# Patient Record
Sex: Female | Born: 1985 | Race: White | Hispanic: No | Marital: Married | Smoking: Never smoker
Health system: Southern US, Community
[De-identification: ages and names within clinical notes are randomized; demographics above are authoritative.]

## PROBLEM LIST (undated history)

## (undated) DIAGNOSIS — E785 Hyperlipidemia, unspecified: Secondary | ICD-10-CM

## (undated) DIAGNOSIS — G43909 Migraine, unspecified, not intractable, without status migrainosus: Secondary | ICD-10-CM

## (undated) DIAGNOSIS — K589 Irritable bowel syndrome without diarrhea: Secondary | ICD-10-CM

## (undated) DIAGNOSIS — K219 Gastro-esophageal reflux disease without esophagitis: Secondary | ICD-10-CM

## (undated) DIAGNOSIS — N809 Endometriosis, unspecified: Secondary | ICD-10-CM

## (undated) DIAGNOSIS — R7611 Nonspecific reaction to tuberculin skin test without active tuberculosis: Secondary | ICD-10-CM

## (undated) HISTORY — DX: Endometriosis, unspecified: N80.9

## (undated) HISTORY — DX: Hyperlipidemia, unspecified: E78.5

## (undated) HISTORY — DX: Irritable bowel syndrome, unspecified: K58.9

## (undated) HISTORY — DX: Gastro-esophageal reflux disease without esophagitis: K21.9

## (undated) HISTORY — DX: Nonspecific reaction to tuberculin skin test without active tuberculosis: R76.11

## (undated) HISTORY — PX: TONSILLECTOMY AND ADENOIDECTOMY: SUR1326

## (undated) HISTORY — PX: ENDOMETRIAL BIOPSY: SHX622

## (undated) HISTORY — DX: Migraine, unspecified, not intractable, without status migrainosus: G43.909

---
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
## 2005-07-23 ENCOUNTER — Other Ambulatory Visit: Admission: RE | Admit: 2005-07-23 | Discharge: 2005-07-23 | Payer: Self-pay | Admitting: Obstetrics and Gynecology

## 2005-08-13 ENCOUNTER — Encounter: Admission: RE | Admit: 2005-08-13 | Discharge: 2005-11-11 | Payer: Self-pay | Admitting: Obstetrics and Gynecology

## 2006-05-03 ENCOUNTER — Emergency Department (HOSPITAL_COMMUNITY): Admission: EM | Admit: 2006-05-03 | Discharge: 2006-05-03 | Payer: Self-pay | Admitting: Family Medicine

## 2006-08-03 ENCOUNTER — Emergency Department (HOSPITAL_COMMUNITY): Admission: EM | Admit: 2006-08-03 | Discharge: 2006-08-03 | Payer: Self-pay | Admitting: Family Medicine

## 2006-09-16 ENCOUNTER — Other Ambulatory Visit: Admission: RE | Admit: 2006-09-16 | Discharge: 2006-09-16 | Payer: Self-pay | Admitting: Obstetrics and Gynecology

## 2009-03-22 ENCOUNTER — Encounter: Admission: RE | Admit: 2009-03-22 | Discharge: 2009-03-22 | Payer: Self-pay | Admitting: Internal Medicine

## 2010-05-14 ENCOUNTER — Ambulatory Visit: Payer: Self-pay | Admitting: Family Medicine

## 2010-05-14 DIAGNOSIS — G43909 Migraine, unspecified, not intractable, without status migrainosus: Secondary | ICD-10-CM

## 2010-05-14 LAB — CONVERTED CEMR LAB: Blood Glucose, Fingerstick: 104

## 2010-12-11 NOTE — Assessment & Plan Note (Signed)
Summary: HEADACHES & NAUSEA/KH   Vital Signs:  Patient Profile:   25 Years Old Female CC:      HAs, nausea, eye pain X 2 weeks, rm 1 Height:     67 inches Weight:      252 pounds O2 Sat:      98 % O2 treatment:    Room Air Temp:     97.4 degrees F oral Pulse rate:   84 / minute Resp:     16 per minute BP sitting:   129 / 74  (right arm) Cuff size:   large  Pt. in pain?   yes    Location:   head    Intensity:   9    Type:       aching  Vitals Entered By: Lajean Saver RN (May 14, 2010 12:00 PM)              Is Patient Diabetic? No CBG Result 104      Updated Prior Medication List: LOESTRIN FE 1.5/30 1.5-30 MG-MCG TABS (NORETHIN ACE-ETH ESTRAD-FE) once daily GABAPENTIN 600 MG TABS (GABAPENTIN) once daily FLEXERIL 10 MG TABS (CYCLOBENZAPRINE HCL) prn  Current Allergies: No known allergies History of Present Illness Chief Complaint: HAs, nausea, eye pain X 2 weeks, rm 1 History of Present Illness:  Subjective:  Patient has a long history of migraine headaches for which she normally takes Neurontin,  and Flexeril as needed.  She is a Runner, broadcasting/film/video, and since being out of school for the past two weeks, her headaches have occurred daily.  She awakens with a headache, and goes to bed with the headaches, although they do not awaken her.  She occasionally has nausea.  She has increased her dose of Neurontin without improvement.  No neurologic symptoms.  No fevers, chills, and sweats.   She has a family history of diabetes, and notes that she has increased thirst but no urinary frequency.  REVIEW OF SYSTEMS Constitutional Symptoms      Denies fever, chills, night sweats, weight loss, weight gain, and fatigue.  Eyes       Complains of eye pain and glasses.      Denies change in vision, eye discharge, contact lenses, and eye surgery.      Comments: pressure Ear/Nose/Throat/Mouth       Denies hearing loss/aids, change in hearing, ear pain, ear discharge, dizziness, frequent runny  nose, frequent nose bleeds, sinus problems, sore throat, hoarseness, and tooth pain or bleeding.  Respiratory       Denies dry cough, productive cough, wheezing, shortness of breath, asthma, bronchitis, and emphysema/COPD.  Cardiovascular       Denies murmurs, chest pain, and tires easily with exhertion.    Gastrointestinal       Complains of nausea/vomiting.      Denies stomach pain, diarrhea, constipation, blood in bowel movements, and indigestion.      Comments: no vomiting Genitourniary       Denies painful urination, kidney stones, and loss of urinary control. Neurological       Complains of headaches.      Denies paralysis, seizures, and fainting/blackouts. Musculoskeletal       Denies muscle pain, joint pain, joint stiffness, decreased range of motion, redness, swelling, muscle weakness, and gout.  Skin       Denies bruising, unusual mles/lumps or sores, and hair/skin or nail changes.  Psych       Denies mood changes, temper/anger issues, anxiety/stress, speech problems, depression, and  sleep problems. Other Comments: Patient has had migrianes in the past, this one has now lasted 2 weeks. She recently moved to [place], has a new PCP she has seen once but they have not addressed her migraines yet.   Past History:  Past Medical History: Migrianes endometriosis  Past Surgical History: Tonsillectomy 1992 laproscopic for endometriosis 2005  Family History: Mother- Migrianes  Social History: Never Smoked Alcohol use-no Drug use-no Occupation: Runner, broadcasting/film/video Single Smoking Status:  never Drug Use:  no   Objective:  Appearance:  Patient is obese but otherwise appears healthy, stated age, and in no acute distress  Eyes:  Pupils are equal, round, and reactive to light and accomdation.  Extraocular movement is intact.  Conjunctivae are not inflamed.  Fundi normal Ears:  Canals normal.  Tympanic membranes normal.   Nose:  Normal septum.  Normal turbinates, mildly congested.    No sinus tenderness present.  Mouth:  No lesions Pharynx:  Normal  Neck:  Supple.  No adenopathy is present.  No thyromegaly is present  Lungs:  Clear to auscultation.  Breath sounds are equal.  Heart:  Regular rate and rhythm without murmurs, rubs, or gallops.  Abdomen:  Nontender without masses or hepatosplenomegaly.  Bowel sounds are present.  No CVA or flank tenderness.  Neurologic:  Cranial nerves 2 through 12 are normal.  Patellar and elbow reflexes are normal.  Cerebellar function is intact.  Gait and station are normal.  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  Max of 6/day  #15 (fifteen) x 0, 05/14/2010, Donna Christen MD  New Orders: New Patient Level III [99203] Capillary Blood Glucose/CBG [27253] Planning Comments:   Rx given for Fioricet and Promethazine for occasional use. Recommend that she follow-up with neurologist for further evaluation and workup.   The patient and/or caregiver has been counseled thoroughly with regard to medications prescribed including dosage, schedule, interactions, rationale for use, and possible side effects and they verbalize understanding.  Diagnoses and expected course of recovery discussed and will return if not improved as expected or if the condition worsens. Patient and/or caregiver verbalized understanding.  Prescriptions: PROMETHAZINE HCL 25 MG TABS (PROMETHAZINE HCL) 1 by mouth q4 to 6hr as needed nausea  #12 x 0   Entered and Authorized by:   Donna Christen MD   Signed by:   Donna Christen MD on 05/14/2010   Method used:   Print then Give to Patient   RxID:   [id number] FIORICET 50-325-40 MG TABS  [facility]) One or two by mouth q4 to 6hr as needed headache.  Max of 6/day  #15 (fifteen) x 0   Entered and Authorized by:   Donna Christen MD   Signed by:   Donna Christen MD on 05/14/2010   Method used:   Print then Give to Patient   RxID:   [id number]   Orders Added: 1)  New Patient Level III [06301] 2)  Capillary Blood Glucose/CBG [60109]

## 2011-02-11 ENCOUNTER — Other Ambulatory Visit: Payer: Self-pay | Admitting: Obstetrics and Gynecology

## 2011-03-29 NOTE — H&P (Signed)
Gloria Dyer, MANOCCHIO                      ACCOUNT NO.:  [id number]   MEDICAL RECORD NO.:  [id number]                   PATIENT TYPE:  OPS   LOCATION:                                       FACILITY:  [facility]   PHYSICIAN:  James A. Ashley Royalty, M.D.             DATE OF BIRTH:   DATE OF ADMISSION:  06/15/2004  DATE OF DISCHARGE:                                HISTORY & PHYSICAL   HISTORY OF PRESENT ILLNESS:  [age]-year-old nulligravida presented to  me, May 25, 2004, complaining of pelvic pain.  It is of some 18 months'  duration.  When it occurs, it seems to last most of the day.  It is quite  debilitating.  She also has significant dysmenorrhea.  She was first seen in  my office, January 2005, by Velora Mediate, NP.  A presumptive diagnosis of  endometriosis was rendered and the patient was treated with Seasonale.  She  seemed to do reasonably well, until approximately 2-1/2 months into the  treatment, when she began having breakthrough bleeding.  The patient  subsequently got an opinion from another OB/GYN physician in this community  and she was placed on monophasic oral contraceptives provided in monthly  increments.  Her symptoms persisted.  She stated that the particular  physician advocated Lupron and the patient and her mother were uncomfortable  with that without a laparoscopy; hence, she is for diagnostic/operative  laparoscopy.   MEDICATIONS:  Albuterol, Zomig.   PAST MEDICAL HISTORY:   MEDICAL:  1. Asthma.  2. Migraines.   SURGICAL:  Tonsillectomy.   ALLERGIES:  None.   ALLERGIES:  Noncontributory.   SOCIAL HISTORY:  The patient denies use of tobacco or significant alcohol.  She is a Holiday representative at [employer].  She states she has never  been sexually active.   PHYSICAL EXAMINATION:  GENERAL:  Well-developed, well-nourished, pleasant  white female in no acute distress, afebrile.  VITAL SIGNS:  Stable.  SKIN:  Skin warm and dry without lesions.  LYMPH:  There is no supraclavicular, cervical or inguinal adenopathy.  HEENT:  Normocephalic.  NECK:  Neck supple without thyromegaly.  CHEST:  Lungs are clear.  CARDIAC:  Regular rate and rhythm.  BREASTS:  Breast exam is deferred as it was current, January 2005.  ABDOMEN:  Abdomen is soft and nontender without masses or organomegaly.  Bowel sounds are active.  MUSCULOSKELETAL:  Examination reveals full range of motion without edema,  cyanosis or CVA tenderness.  PELVIC:  Examination (May 25, 2004):  External genitalia within normal  limits.  Urethral meatus was normal.  Vagina and cervix were without gross  lesions.  Bimanual examination reveals the uterus to be approximately 8.0 x  4.4 cm and no adnexal masses are palpable.   IMPRESSION:  1. Pelvic pain -- etiology uncertain.  Differential include endometriosis,     primary gastrointestinal, adhesions, adnexal pathology, etc.  2. Asthma.  3. Migraine  headaches.   PLAN:  Diagnostic/operative laparoscopy.  Risks, benefits, complications and  alternatives were fully discussed with the patient.  Possibility of  unilateral salpingo-oophorectomy discussed and accepted.  Possibility of  exploratory laparotomy discussed and accepted.  Questions invited and  answered.                                               James A. Ashley Royalty, M.D.    JAM/MEDQ  D:  06/15/2004  T:  06/15/2004  Job:  [id number]

## 2011-03-29 NOTE — Op Note (Signed)
NAME:  Gloria Dyer, Gloria Dyer                        ACCOUNT NO.:  [id number]   MEDICAL RECORD NO.:  [id number]                   PATIENT TYPE:  AMB   LOCATION:  [facility]                                  FACILITY:  [facility]   PHYSICIAN:  James A. Ashley Royalty, M.D.             DATE OF BIRTH:  1986-04-13   DATE OF PROCEDURE:  06/15/2004  DATE OF DISCHARGE:  06/15/2004                                 OPERATIVE REPORT   PREOPERATIVE DIAGNOSIS:  Pelvic pain, rule out endometriosis.   POSTOPERATIVE DIAGNOSIS:  Pelvic pain, rule out endometriosis.  Pathology  pending.   PROCEDURES:  1. Diagnostic/operative laparoscopy.  2. Biopsies of left ovary and peritoneum (two).   SURGEON:  Rudy Jew. Ashley Royalty, M.D.   ANESTHESIA:  General.   ESTIMATED BLOOD LOSS:  Less than 25 mL.   COMPLICATIONS:  None.   PACKS AND DRAINS:  None.   PROCEDURE:  The patient was taken to the operating room and placed in the  dorsal supine position.  After general anesthesia was administered, she was  placed in the lithotomy position and prepped and draped in the usual manner  for abdominal and vaginal surgery.  A posterior weighted retractor was  placed per vagina.  The anterior lip of the cervix was grasped with a single-  tooth tenaculum.  The Jarcho uterine manipulator was placed per cervix and  held in place with a tenaculum.  The bladder was drained with a red rubber  catheter.   A 1.2 cm infraumbilical incision was then made in the longitudinal plane.  A  size 10/11 disposable laparoscopic trocar was placed into the abdominal  cavity.  Location was verified by placement of the laparoscope.  There was  no evidence of any trauma.  Pneumoperitoneum was created with CO2 and  maintained throughout the procedure.  Again careful assessment of the field  revealed no evidence of trauma.  The pelvis was then surveyed.  The uterus  appeared normal size, shape, and contour.  At this point two 5 mm disposable  suprapubic trocars were  placed in the left and right lower quadrants,  respectively.  Transillumination and direct visualization techniques were  employed.  The pelvis was further inspected.  The uterus appeared to be  approximately 4 x 4 x 4 cm in terms of corporal dimensions.  I could  appreciate no obvious evidence of endometriosis on the uterus.  The  fallopian tubes were normal size, shape, and contour bilaterally with  luxuriant fimbriae.  The right ovary appeared to be normal size, shape, and  contour without evidence of any cysts or endometriosis.  The left ovary  appeared to be normal size, shape, and contour.  There was a pigmented  lesion on the ovary on the pole closest to the fimbriated end of the  fallopian tube.  There also appeared to be perhaps a cystic follicle on the  ovary.  The anterior cul-de-sac was clean.  The posterior cul-de-sac  contained two lesions.  In the left uterosacral area there was a pigmented  implant.  In addition, on the right aspect of the cul-de-sac there was also  a separate pigmented implant.  Using the laparoscopic biopsy forceps,  biopsies were obtained from all three aforementioned areas.  Hemostasis was  easily obtained with the bipolar cautery.  Careful attention was paid to  avoid the ureter or other vital structures.  All specimens were submitted to  pathology for histologic studies.  Copious irrigation was accomplished with  the Nezhat suction irrigator.  The remainder of the peritoneal surfaces were  noted to be smooth and glistening.  Appropriate photos were obtained before  and after biopsies.  The appendix appeared to be normal.   At this point the patient was felt to have benefitted maximally from the  surgical procedure.  The abdominal instruments were removed and  pneumoperitoneum evacuated.  Fascial defects were closed with 0 Vicryl in an  interrupted fashion.  The skin was closed with 3-0 Monocryl in a  subcuticular fashion.  The vaginal instruments were  removed.  Two small  sulcal lacerations were easily closed with 3-0 chromic catgut.  Approximately 10 mL of 0.5% Marcaine with 1:200,000 epinephrine were  instilled into the incisions to aid in postoperative analgesia.   At this point the entire field was noted to be hemostatic and the procedure  terminated.  The patient was then taken to the recovery room in excellent  condition.                                               James A. Ashley Royalty, M.D.    JAM/MEDQ  D:  06/15/2004  T:  06/17/2004  Job:  [id number]

## 2014-05-05 ENCOUNTER — Other Ambulatory Visit: Payer: Self-pay | Admitting: Family Medicine

## 2014-05-05 DIAGNOSIS — R197 Diarrhea, unspecified: Secondary | ICD-10-CM

## 2014-05-06 ENCOUNTER — Ambulatory Visit
Admission: RE | Admit: 2014-05-06 | Discharge: 2014-05-06 | Disposition: A | Discharge status: Home / Self Care | Payer: BC Managed Care – PPO | Source: Ambulatory Visit | Attending: Family Medicine | Admitting: Family Medicine

## 2014-05-06 DIAGNOSIS — R197 Diarrhea, unspecified: Secondary | ICD-10-CM

## 2014-05-06 MED ORDER — IOHEXOL 300 MG/ML  SOLN
125.0000 mL | Freq: Once | INTRAMUSCULAR | Status: AC | PRN
  Administered 2014-05-06: 125 mL via INTRAVENOUS

## 2014-10-14 LAB — HM PAP SMEAR: HM Pap smear: NORMAL

## 2016-11-01 ENCOUNTER — Ambulatory Visit (INDEPENDENT_AMBULATORY_CARE_PROVIDER_SITE_OTHER): Payer: BC Managed Care – PPO | Admitting: Primary Care

## 2016-11-01 ENCOUNTER — Encounter: Payer: Self-pay | Admitting: Primary Care

## 2016-11-01 ENCOUNTER — Encounter (INDEPENDENT_AMBULATORY_CARE_PROVIDER_SITE_OTHER): Payer: Self-pay

## 2016-11-01 ENCOUNTER — Other Ambulatory Visit: Payer: Self-pay | Admitting: Primary Care

## 2016-11-01 VITALS — BP 108/70 | HR 87 | Temp 97.8°F | Ht 67.5 in | Wt 273.8 lb

## 2016-11-01 DIAGNOSIS — E785 Hyperlipidemia, unspecified: Secondary | ICD-10-CM

## 2016-11-01 DIAGNOSIS — G43901 Migraine, unspecified, not intractable, with status migrainosus: Secondary | ICD-10-CM | POA: Diagnosis not present

## 2016-11-01 DIAGNOSIS — K219 Gastro-esophageal reflux disease without esophagitis: Secondary | ICD-10-CM

## 2016-11-01 DIAGNOSIS — G43701 Chronic migraine without aura, not intractable, with status migrainosus: Secondary | ICD-10-CM

## 2016-11-01 DIAGNOSIS — J329 Chronic sinusitis, unspecified: Secondary | ICD-10-CM | POA: Diagnosis not present

## 2016-11-01 LAB — LIPID PANEL
Cholesterol: 137 mg/dL (ref 0–200)
HDL: 37.6 mg/dL — ABNORMAL LOW (ref >39.00)
LDL Cholesterol: 86 mg/dL (ref 0–99)
NONHDL: 99.77
Total CHOL/HDL Ratio: 4
Triglycerides: 67 mg/dL (ref 0.0–149.0)
VLDL: 13.4 mg/dL (ref 0.0–40.0)

## 2016-11-01 LAB — COMPREHENSIVE METABOLIC PANEL
ALBUMIN: 3.6 g/dL (ref 3.5–5.2)
ALK PHOS: 76 U/L (ref 39–117)
ALT: 15 U/L (ref 0–35)
AST: 18 U/L (ref 0–37)
BILIRUBIN TOTAL: 0.6 mg/dL (ref 0.2–1.2)
BUN: 8 mg/dL (ref 6–23)
CO2: 26 mEq/L (ref 19–32)
CREATININE: 0.61 mg/dL (ref 0.40–1.20)
Calcium: 8.6 mg/dL (ref 8.4–10.5)
Chloride: 107 mEq/L (ref 96–112)
GFR: 122.2 mL/min (ref >60.00)
Glucose, Bld: 95 mg/dL (ref 70–99)
Potassium: 4.1 mEq/L (ref 3.5–5.1)
SODIUM: 139 meq/L (ref 135–145)
TOTAL PROTEIN: 6.9 g/dL (ref 6.0–8.3)

## 2016-11-01 LAB — HEMOGLOBIN A1C: HEMOGLOBIN A1C: 5.4 % (ref 4.6–6.5)

## 2016-11-01 MED ORDER — MONTELUKAST SODIUM 10 MG PO TABS: 10.0000 mg | ORAL_TABLET | Freq: Every day | ORAL | 3 refills | Status: DC

## 2016-11-01 MED ORDER — ROSUVASTATIN CALCIUM 5 MG PO TABS: 5.0000 mg | ORAL_TABLET | Freq: Every day | ORAL | 3 refills | Status: DC

## 2016-11-01 MED ORDER — RANITIDINE HCL 150 MG PO TABS: 150.0000 mg | ORAL_TABLET | Freq: Two times a day (BID) | ORAL | 1 refills | Status: DC

## 2016-11-01 MED ORDER — GABAPENTIN 600 MG PO TABS: 600.0000 mg | ORAL_TABLET | Freq: Every day | ORAL | 3 refills | Status: DC

## 2016-11-01 NOTE — Progress Notes (Signed)
Pre visit review using our clinic review tool, if applicable. No additional management support is needed unless otherwise documented below in the visit note. 

## 2016-11-01 NOTE — Progress Notes (Signed)
   Subjective:    Patient ID: Gloria Dyer, female    DOB: 19-Jun-1986, 30 y.o.   MRN: [id number]  HPI  Ms. Gloria Dyer is a 30 year old female who presents today to establish care and discuss the problems mentioned below. Will obtain old records.  1) Hyperlipidemia: Diagnosed two years ago. Currently managed on atorvastatin 10 mg. Her last lipid panel was in June 2017. She does experience cramping to her legs and feet that has been ongoing for the past 1 year.  2) GERD/IBS: Currently managed on pantoprazole 20 mg daily which she's taken for several years. No recent flare in 1 year. Previously followed by GI who underwent extensive testing, but she'd like to transfer care of GERD to our clinic.  3) Asthma/Recurrent Sinusitis: Currently managed on Singulair 10 mg, mostly for recurrent sinusitis. No asthma symptoms in years. She denies wheezing and shortness of breath. She does not have an inhaler.  4) Migraines: Chronic since high school. Migraines occur once monthly on average. She's currently managed on Gabapentin 600 mg once daily. Rarely experiences headaches. She feels well managed.  Review of Systems  Eyes: Negative for photophobia.  Respiratory: Negative for cough, shortness of breath and wheezing.   Gastrointestinal:       Gloria Dyer, no recent symptoms  Musculoskeletal: Positive for myalgias.  Neurological: Negative for headaches.       Past Medical History:  Diagnosis Date  . Endometriosis   . GERD (gastroesophageal reflux disease)   . Hyperlipidemia   . IBS (irritable bowel syndrome)   . Migraines   . 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 Marital status: Married    Spouse name: N/A  . Number of children: N/A  . Years of education: N/A   Occupational History  . Not on file.   Social History Main Topics  . Smoking status: Never Smoker  . Smokeless tobacco: Not on file  . Alcohol use No  . Drug use: Unknown  . Sexual activity: Not  on file   Other Topics Concern  . Not on file   Social History Narrative   Single.   No children.   Works as a Runner, broadcasting/film/videoteacher.   Currently in graduate school.    Past Surgical History:  Procedure Laterality Date  . ENDOMETRIAL BIOPSY      Family History  Problem Relation Age of Onset  . Breast cancer Mother   . Asthma Mother   . Hyperlipidemia Father   . Diabetes Paternal Grandmother   . Diabetes Paternal Grandfather     No Known Allergies  No current outpatient prescriptions on file prior to visit.   No current facility-administered medications on file prior to visit.     BP 108/70   Pulse 87   Temp 97.8 F (36.6 C) (Oral)   Ht 5' 7.5" (1.715 m)   Wt 273 lb 12.8 oz (124.2 kg)   SpO2 98%   BMI 42.25 kg/m    Objective:   Physical Exam  Constitutional: She appears well-nourished.  Neck: Neck supple.  Cardiovascular: Normal rate and regular rhythm.   Pulmonary/Chest: Effort normal and breath sounds normal.  Skin: Skin is warm and dry.  Psychiatric: She has a normal mood and affect.          Assessment & Plan:

## 2016-11-01 NOTE — Assessment & Plan Note (Signed)
Managed on atorvastatin 10 mg. Strong FH of heart disease and diabetes. Check lipids today, if stable, then switch to low dose Crestor given myalgias. Check LFT's and A1C today.

## 2016-11-01 NOTE — Assessment & Plan Note (Signed)
Managed on Singulair daily for prevention. No sinus infection in 1 year. Continue same, refill provided.

## 2016-11-01 NOTE — Assessment & Plan Note (Signed)
Chronic since high school. Managed on gabapentin for prevention, doing well. Continue current regimen, refill provided.

## 2016-11-01 NOTE — Assessment & Plan Note (Signed)
Managed on PPI x 2 years, would like to see her try to wean down. Rx for Zantac sent to pharmacy. Will have her try once daily, if no improvement then twice daily. Will check on her in 1 month.

## 2016-11-01 NOTE — Patient Instructions (Signed)
Start Zantac 150 mg tablets for acid reflux. Start by taking 1 tablet by mouth daily. May increase to 1 tablet twice daily. I will call you in a few weeks for an update.  Complete lab work prior to leaving today.   I sent refills for gabapentin and Singulair to your pharmacy. I will refill the cholesterol medication once I receive your cholesterol results.  We will be in touch later today or next week!  It was a pleasure to meet you today! Please don't hesitate to call me with any questions. Welcome to [facility]!  Food Choices for Gastroesophageal Reflux Disease, Adult When you have gastroesophageal reflux disease (GERD), the foods you eat and your eating habits are very important. Choosing the right foods can help ease the discomfort of GERD. What general guidelines do I need to follow?  Choose fruits, vegetables, whole grains, low-fat dairy products, and low-fat meat, fish, and poultry.  Limit fats such as oils, salad dressings, butter, nuts, and avocado.  Keep a food diary to identify foods that cause symptoms.  Avoid foods that cause reflux. These may be different for different people.  Eat frequent small meals instead of three large meals each day.  Eat your meals slowly, in a relaxed setting.  Limit fried foods.  Cook foods using methods other than frying.  Avoid drinking alcohol.  Avoid drinking large amounts of liquids with your meals.  Avoid bending over or lying down until 2-3 hours after eating. What foods are not recommended? The following are some foods and drinks that may worsen your symptoms: Vegetables  Tomatoes. Tomato juice. Tomato and spaghetti sauce. Chili peppers. Onion and garlic. Horseradish. Fruits  Oranges, grapefruit, and lemon (fruit and juice). Meats  High-fat meats, fish, and poultry. This includes hot dogs, ribs, ham, sausage, salami, and bacon. Dairy  Whole milk and chocolate milk. Sour cream. Cream. Butter. Ice cream. Cream  cheese. Beverages  Coffee and tea, with or without caffeine. Carbonated beverages or energy drinks. Condiments  Hot sauce. Barbecue sauce. Sweets/Desserts  Chocolate and cocoa. Donuts. Peppermint and spearmint. Fats and Oils  High-fat foods, including [place] fries and potato chips. Other  Vinegar. Strong spices, such as black pepper, white pepper, red pepper, cayenne, curry powder, cloves, ginger, and chili powder. The items listed above may not be a complete list of foods and beverages to avoid. Contact your dietitian for more information.  This information is not intended to replace advice given to you by your health care provider. Make sure you discuss any questions you have with your health care provider. Document Released: 10/28/2005 Document Revised: 04/04/2016 Document Reviewed: 09/01/2013 Elsevier Interactive Patient Education  2017 [employer]

## 2016-11-03 ENCOUNTER — Encounter: Payer: Self-pay | Admitting: Primary Care

## 2016-11-11 ENCOUNTER — Encounter: Payer: Self-pay | Admitting: Primary Care

## 2016-11-12 ENCOUNTER — Other Ambulatory Visit: Payer: Self-pay | Admitting: Primary Care

## 2016-11-12 DIAGNOSIS — K219 Gastro-esophageal reflux disease without esophagitis: Secondary | ICD-10-CM

## 2016-11-12 MED ORDER — PANTOPRAZOLE SODIUM 20 MG PO TBEC: 20.0000 mg | DELAYED_RELEASE_TABLET | Freq: Every day | ORAL | 1 refills | Status: DC

## 2016-11-14 ENCOUNTER — Encounter: Payer: Self-pay | Admitting: Primary Care

## 2016-12-20 ENCOUNTER — Encounter: Payer: Self-pay | Admitting: Primary Care

## 2016-12-26 LAB — HM PAP SMEAR: HM PAP: NORMAL

## 2016-12-30 ENCOUNTER — Encounter: Payer: Self-pay | Admitting: Primary Care

## 2016-12-30 ENCOUNTER — Other Ambulatory Visit: Payer: Self-pay | Admitting: Primary Care

## 2016-12-30 DIAGNOSIS — IMO0001 Reserved for inherently not codable concepts without codable children: Secondary | ICD-10-CM

## 2017-01-14 ENCOUNTER — Ambulatory Visit: Payer: BC Managed Care – PPO | Admitting: Dietician

## 2017-01-23 ENCOUNTER — Encounter: Payer: Self-pay | Admitting: Primary Care

## 2017-01-29 ENCOUNTER — Encounter: Payer: BC Managed Care – PPO | Attending: Primary Care | Admitting: Dietician

## 2017-01-29 ENCOUNTER — Encounter: Payer: Self-pay | Admitting: Dietician

## 2017-01-29 VITALS — Ht 67.0 in | Wt 277.1 lb

## 2017-01-29 DIAGNOSIS — K589 Irritable bowel syndrome without diarrhea: Secondary | ICD-10-CM

## 2017-01-29 DIAGNOSIS — E6609 Other obesity due to excess calories: Secondary | ICD-10-CM

## 2017-01-29 DIAGNOSIS — Z6841 Body Mass Index (BMI) 40.0 and over, adult: Secondary | ICD-10-CM | POA: Diagnosis not present

## 2017-01-29 DIAGNOSIS — E785 Hyperlipidemia, unspecified: Secondary | ICD-10-CM

## 2017-01-29 DIAGNOSIS — IMO0001 Reserved for inherently not codable concepts without codable children: Secondary | ICD-10-CM

## 2017-01-29 NOTE — Progress Notes (Signed)
Medical Nutrition Therapy: Visit start time: 1630  end time: 1730  Assessment:  Diagnosis: obesity Past medical history: IBS, hyperlipidemia Psychosocial issues/ stress concerns: patient states she is not dealing well with stress, some family stress and busy schedule.  Preferred learning method:  . Auditory . Visual . Hands-on  Current weight: 277.1lbs with shoes and jacket Height: 5'7" Medications, supplements: reconciled list in medical record.  Progress and evaluation: Patient has worked on weight loss in the past; did weight watchers in high school, lost from about 240lbs to 185lbs. Regained weight in college. 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 Also mentions her sister has an eating disorder, and will finish with second treatment program in May. Dad had stroke 12/25/16, which triggered patient's concern for her own health.  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   Physical activity: walking, not on a regular basis due to work schedule during school year. Has treadmill at home, uses when on break.    Dietary Intake:  Usual eating pattern includes 3 meals and 1 snacks per day. Dining out frequency: 6 meals per week.  Breakfast: on way to work 6:45am pop tart. Water for drink. Hot apple cider for past week, not typical Snack: usually none, forgets to pack Lunch: 11:45am -- peanut butter jelly sandwich, cup of applesauce, sometimes string cheese, water Snack: pretzels 3:30-4pm Supper: takeout on the way home-- chick fila, personal pizza Snack: none Beverages: water, stopped caffeine drinks when diagnosed with IBS.  She reports dairy, fiber, large amounts of whole grain wheat will trigger IBS symptoms. She does not eat vegetables other than occasional salad.   Nutrition Care Education: Topics covered: weight  management with moderate fiber diet for IBS Basic nutrition: basic food groups, appropriate nutrient balance, appropriate meal and snack schedule, general nutrition guidelines    Weight control: Estimated energy needs at 1500kcal daily for weight loss. Provided guidance for balanced meals containing about 45%CHO, 25% protein, and 30% fat. Discussed simple meal options and snack options. Discussed role of physical activity in weight control, effects of dieting history on metabolism. Discussed benefits of tracking food intake.   Nutritional Diagnosis:  Laura-3.3 Overweight/obesity As related to excess calories, inactivity.  As evidenced by BMI 43, patient report.  Intervention: Instruction as noted above.   Set goals with direction from patient.    Encouraged her to seek ways to add movement into daily routine.   Education Materials given:  . Food lists/ Planning A Balanced Meal . Sample meal pattern/ menus . Snacking handout . Goals/ instructions   Learner/ who was taught:  . Patient   Level of understanding: Marland Kitchen. Verbalizes/ demonstrates competency  Demonstrated degree of understanding via:   Teach back Learning barriers: . None  Willingness to learn/ readiness for change: . Acceptance, ready for change  Monitoring and Evaluation:  Dietary intake, exercise, and body weight      follow up: 03/12/17

## 2017-01-29 NOTE — Patient Instructions (Signed)
   Add a healthy snack mid-morning, ideally with some healthy carbohydrate such as graham crackers, fruit, yogurt, or dry cereal like cheerios and combine with protein such as peanut butter, lowfat cheese, or nuts (if tolerated).   Eat a balanced meal for supper with lean protein, controlled portions of starches, and include a fruit if not a salad.   Track food intake using Fitbit app. If it doesn't work, try MyFitnessPal, or Lose It. Both are free apps for tracking intake.

## 2017-02-25 ENCOUNTER — Ambulatory Visit (INDEPENDENT_AMBULATORY_CARE_PROVIDER_SITE_OTHER): Payer: BC Managed Care – PPO | Admitting: Primary Care

## 2017-02-25 ENCOUNTER — Encounter: Payer: Self-pay | Admitting: Primary Care

## 2017-02-25 VITALS — BP 116/78 | HR 109 | Temp 97.8°F | Ht 67.5 in | Wt 276.8 lb

## 2017-02-25 DIAGNOSIS — J069 Acute upper respiratory infection, unspecified: Secondary | ICD-10-CM

## 2017-02-25 MED ORDER — BENZONATATE 200 MG PO CAPS: 200.0000 mg | ORAL_CAPSULE | Freq: Three times a day (TID) | ORAL | 0 refills | Status: DC | PRN

## 2017-02-25 NOTE — Progress Notes (Signed)
Pre visit review using our clinic review tool, if applicable. No additional management support is needed unless otherwise documented below in the visit note. 

## 2017-02-25 NOTE — Patient Instructions (Signed)
Your symptoms are representative of a viral illness which will resolve on its own over time. Our goal is to treat your symptoms in order to aid your body in the healing process and to make you more comfortable.   Nasal Congestion/Ear Pressure: Try using Flonase (fluticasone) nasal spray. Instill 1 spray in each nostril twice daily.   You may take Benzonatate capsules for cough. Take 1 capsule by mouth three times daily as needed for cough.  Please notify me if you start feeling worse Friday this week.  It was a pleasure to see you today!   Upper Respiratory Infection, Adult Most upper respiratory infections (URIs) are a viral infection of the air passages leading to the lungs. A URI affects the nose, throat, and upper air passages. The most common type of URI is nasopharyngitis and is typically referred to as "the common cold." URIs run their course and usually go away on their own. Most of the time, a URI does not require medical attention, but sometimes a bacterial infection in the upper airways can follow a viral infection. This is called a secondary infection. Sinus and middle ear infections are common types of secondary upper respiratory infections. Bacterial pneumonia can also complicate a URI. A URI can worsen asthma and chronic obstructive pulmonary disease (COPD). Sometimes, these complications can require emergency medical care and may be life threatening. What are the causes? Almost all URIs are caused by viruses. A virus is a type of germ and can spread from one person to another. What increases the risk? You may be at risk for a URI if:  You smoke.  You have chronic heart or lung disease.  You have a weakened defense (immune) system.  You are very young or very old.  You have nasal allergies or asthma.  You work in crowded or poorly ventilated areas.  You work in health care facilities or schools. What are the signs or symptoms? Symptoms typically develop 2-3 days after  you come in contact with a cold virus. Most viral URIs last 7-10 days. However, viral URIs from the influenza virus (flu virus) can last 14-18 days and are typically more severe. Symptoms may include:  Runny or stuffy (congested) nose.  Sneezing.  Cough.  Sore throat.  Headache.  Fatigue.  Fever.  Loss of appetite.  Pain in your forehead, behind your eyes, and over your cheekbones (sinus pain).  Muscle aches. How is this diagnosed? Your health care provider may diagnose a URI by:  Physical exam.  Tests to check that your symptoms are not due to another condition such as:  Strep throat.  Sinusitis.  Pneumonia.  Asthma. How is this treated? A URI goes away on its own with time. It cannot be cured with medicines, but medicines may be prescribed or recommended to relieve symptoms. Medicines may help:  Reduce your fever.  Reduce your cough.  Relieve nasal congestion. Follow these instructions at home:  Take medicines only as directed by your health care provider.  Gargle warm saltwater or take cough drops to comfort your throat as directed by your health care provider.  Use a warm mist humidifier or inhale steam from a shower to increase air moisture. This may make it easier to breathe.  Drink enough fluid to keep your urine clear or pale yellow.  Eat soups and other clear broths and maintain good nutrition.  Rest as needed.  Return to work when your temperature has returned to normal or as your health care  provider advises. You may need to stay home longer to avoid infecting others. You can also use a face mask and careful hand washing to prevent spread of the virus.  Increase the usage of your inhaler if you have asthma.  Do not use any tobacco products, including cigarettes, chewing tobacco, or electronic cigarettes. If you need help quitting, ask your health care provider. How is this prevented? The best way to protect yourself from getting a cold is to  practice good hygiene.  Avoid oral or hand contact with people with cold symptoms.  Wash your hands often if contact occurs. There is no clear evidence that vitamin C, vitamin E, echinacea, or exercise reduces the chance of developing a cold. However, it is always recommended to get plenty of rest, exercise, and practice good nutrition. Contact a health care provider if:  You are getting worse rather than better.  Your symptoms are not controlled by medicine.  You have chills.  You have worsening shortness of breath.  You have brown or red mucus.  You have yellow or brown nasal discharge.  You have pain in your face, especially when you bend forward.  You have a fever.  You have swollen neck glands.  You have pain while swallowing.  You have white areas in the back of your throat. Get help right away if:  You have severe or persistent:  Headache.  Ear pain.  Sinus pain.  Chest pain.  You have chronic lung disease and any of the following:  Wheezing.  Prolonged cough.  Coughing up blood.  A change in your usual mucus.  You have a stiff neck.  You have changes in your:  Vision.  Hearing.  Thinking.  Mood. This information is not intended to replace advice given to you by your health care provider. Make sure you discuss any questions you have with your health care provider. Document Released: 04/23/2001 Document Revised: 06/30/2016 Document Reviewed: 02/02/2014 Elsevier Interactive Patient Education  2017 [employer]

## 2017-02-25 NOTE — Progress Notes (Signed)
Subjective:    Patient ID: Gloria Dyer, female    DOB: May 14, 1986, 31 y.o.   MRN: [id number]  HPI  Gloria Dyer is a 31 year old female with a history for allergic rhinitis and recurrent sinusitis who presents today with a chief complaint of cough. She also reports nasal congestion, headache, ear fullness post nasal drip. Her cough is worse at night and is productive in the morning. She denies fevers, body aches, fatigue, sneezing, facial pressure. She's taken Ibuprofen and cough drops with some improvement. Her symptoms began 7 days ago.  Review of Systems  Constitutional: Negative for chills, fatigue and fever.  HENT: Positive for congestion and rhinorrhea. Negative for sinus pressure and sore throat.        Ear fullness  Respiratory: Positive for cough. Negative for shortness of breath.   Cardiovascular: Negative for chest pain.  Allergic/Immunologic: Positive for environmental allergies.  Neurological: Positive for headaches.       Past Medical History:  Diagnosis Date  . Endometriosis   . GERD (gastroesophageal reflux disease)   . Hyperlipidemia   . IBS (irritable bowel syndrome)   . Migraines   . 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 Marital status: Married    Spouse name: N/A  . Number of children: N/A  . Years of education: N/A   Occupational History  . Not on file.   Social History Main Topics  . Smoking status: Never Smoker  . Smokeless tobacco: Former Neurosurgeon  . Alcohol use No  . Drug use: Unknown  . Sexual activity: Not on file   Other Topics Concern  . Not on file   Social History Narrative   Single.   No children.   Works as a Runner, broadcasting/film/video.   Currently in graduate school.    Past Surgical History:  Procedure Laterality Date  . ENDOMETRIAL BIOPSY      Family History  Problem Relation Age of Onset  . Breast cancer Mother   . Asthma Mother   . Hyperlipidemia Father   . Diabetes Paternal Grandmother   .  Diabetes Paternal Grandfather     No Known Allergies  Current Outpatient Prescriptions on File Prior to Visit  Medication Sig Dispense Refill  . gabapentin (NEURONTIN) 600 MG tablet Take 1 tablet (600 mg total) by mouth at bedtime. 90 tablet 3  . Melatonin 3 MG TABS Take 3 mg by mouth.    . montelukast (SINGULAIR) 10 MG tablet Take 1 tablet (10 mg total) by mouth at bedtime. 90 tablet 3  . Norethindrone Acetate-Ethinyl Estradiol (JUNEL 1.5/30) 1.5-30 MG-MCG tablet Take by mouth.    . pantoprazole (PROTONIX) 20 MG tablet Take 1 tablet (20 mg total) by mouth daily. 90 tablet 1  . Probiotic Product (ALIGN PO) Take 1 tablet by mouth.    . rosuvastatin (CRESTOR) 5 MG tablet Take 1 tablet (5 mg total) by mouth daily. 90 tablet 3   No current facility-administered medications on file prior to visit.     BP 116/78   Pulse (!) 109   Temp 97.8 F (36.6 C) (Oral)   Ht 5' 7.5" (1.715 m)   Wt 276 lb 12.8 oz (125.6 kg)   SpO2 98%   BMI 42.71 kg/m    Objective:   Physical Exam  Constitutional: She appears well-nourished.  HENT:  Right Ear: Tympanic membrane and ear canal normal.  Left Ear: Tympanic membrane and  ear canal normal.  Nose: Right sinus exhibits no maxillary sinus tenderness and no frontal sinus tenderness. Left sinus exhibits no maxillary sinus tenderness and no frontal sinus tenderness.  Mouth/Throat: Oropharynx is clear and moist.  Eyes: Conjunctivae are normal.  Neck: Neck supple.  Cardiovascular: Normal rate and regular rhythm.   Pulmonary/Chest: Effort normal and breath sounds normal. She has no wheezes. She has no rales.  Dry cough during exam  Lymphadenopathy:    She has no cervical adenopathy.  Skin: Skin is warm and dry.          Assessment & Plan:  URI:  Cough, nasal congestion, rhinorrhea x 7 days. Some improvement with OTC treatment. Exam today not suggestive of bacterial involvement.  Suspect viral URI that should improve in 3 days. Rx for Tessalon  Pearls PRN. Discussed use of Flonase, Ibuprofen. Fluids, rest, follow up PRN.  Morrie Sheldon, NP

## 2017-03-05 ENCOUNTER — Encounter: Payer: Self-pay | Admitting: Primary Care

## 2017-03-08 ENCOUNTER — Encounter: Payer: Self-pay | Admitting: Dietician

## 2017-03-12 ENCOUNTER — Ambulatory Visit: Payer: BC Managed Care – PPO | Admitting: Dietician

## 2017-03-31 ENCOUNTER — Encounter: Payer: Self-pay | Admitting: Dietician

## 2017-04-03 ENCOUNTER — Ambulatory Visit: Payer: BC Managed Care – PPO | Admitting: Dietician

## 2017-04-25 ENCOUNTER — Encounter: Payer: Self-pay | Admitting: Dietician

## 2017-04-25 NOTE — Progress Notes (Signed)
Ms. Jannifer RodneyHartgrove has not yet responded to electronic message to reschedule her cancelled appointment. Sent letter to MD office.

## 2017-04-28 ENCOUNTER — Other Ambulatory Visit: Payer: Self-pay | Admitting: Primary Care

## 2017-04-28 DIAGNOSIS — K219 Gastro-esophageal reflux disease without esophagitis: Secondary | ICD-10-CM

## 2017-07-05 ENCOUNTER — Encounter: Payer: Self-pay | Admitting: Primary Care

## 2017-09-24 ENCOUNTER — Encounter: Payer: Self-pay | Admitting: Primary Care

## 2017-10-18 ENCOUNTER — Other Ambulatory Visit: Payer: Self-pay | Admitting: Primary Care

## 2017-10-18 DIAGNOSIS — K219 Gastro-esophageal reflux disease without esophagitis: Secondary | ICD-10-CM

## 2017-10-28 ENCOUNTER — Other Ambulatory Visit: Payer: Self-pay | Admitting: Primary Care

## 2017-10-28 DIAGNOSIS — G43901 Migraine, unspecified, not intractable, with status migrainosus: Secondary | ICD-10-CM

## 2017-10-28 DIAGNOSIS — E785 Hyperlipidemia, unspecified: Secondary | ICD-10-CM

## 2017-10-28 DIAGNOSIS — J329 Chronic sinusitis, unspecified: Secondary | ICD-10-CM

## 2017-11-05 ENCOUNTER — Other Ambulatory Visit: Payer: Self-pay | Admitting: Primary Care

## 2017-11-05 DIAGNOSIS — Z0000 Encounter for general adult medical examination without abnormal findings: Secondary | ICD-10-CM

## 2017-11-12 ENCOUNTER — Other Ambulatory Visit (INDEPENDENT_AMBULATORY_CARE_PROVIDER_SITE_OTHER): Payer: BC Managed Care – PPO

## 2017-11-12 DIAGNOSIS — Z0000 Encounter for general adult medical examination without abnormal findings: Secondary | ICD-10-CM | POA: Diagnosis not present

## 2017-11-12 LAB — COMPREHENSIVE METABOLIC PANEL
ALBUMIN: 3.4 g/dL — AB (ref 3.5–5.2)
ALT: 11 U/L (ref 0–35)
AST: 11 U/L (ref 0–37)
Alkaline Phosphatase: 64 U/L (ref 39–117)
BILIRUBIN TOTAL: 0.5 mg/dL (ref 0.2–1.2)
BUN: 11 mg/dL (ref 6–23)
CALCIUM: 8.2 mg/dL — AB (ref 8.4–10.5)
CHLORIDE: 106 meq/L (ref 96–112)
CO2: 25 mEq/L (ref 19–32)
Creatinine, Ser: 0.62 mg/dL (ref 0.40–1.20)
GFR: 119.11 mL/min (ref >60.00)
Glucose, Bld: 86 mg/dL (ref 70–99)
Potassium: 3.7 mEq/L (ref 3.5–5.1)
Sodium: 139 mEq/L (ref 135–145)
Total Protein: 6.5 g/dL (ref 6.0–8.3)

## 2017-11-12 LAB — LIPID PANEL
CHOLESTEROL: 137 mg/dL (ref 0–200)
HDL: 36.5 mg/dL — AB (ref >39.00)
LDL CALC: 82 mg/dL (ref 0–99)
NonHDL: 100.34
TRIGLYCERIDES: 91 mg/dL (ref 0.0–149.0)
Total CHOL/HDL Ratio: 4
VLDL: 18.2 mg/dL (ref 0.0–40.0)

## 2017-11-19 ENCOUNTER — Ambulatory Visit (INDEPENDENT_AMBULATORY_CARE_PROVIDER_SITE_OTHER): Payer: BC Managed Care – PPO | Admitting: Primary Care

## 2017-11-19 ENCOUNTER — Encounter: Payer: Self-pay | Admitting: Primary Care

## 2017-11-19 VITALS — BP 118/80 | HR 88 | Temp 98.3°F | Ht 68.0 in | Wt 280.0 lb

## 2017-11-19 DIAGNOSIS — Z0000 Encounter for general adult medical examination without abnormal findings: Secondary | ICD-10-CM | POA: Diagnosis not present

## 2017-11-19 DIAGNOSIS — E785 Hyperlipidemia, unspecified: Secondary | ICD-10-CM

## 2017-11-19 DIAGNOSIS — K219 Gastro-esophageal reflux disease without esophagitis: Secondary | ICD-10-CM

## 2017-11-19 DIAGNOSIS — G43701 Chronic migraine without aura, not intractable, with status migrainosus: Secondary | ICD-10-CM

## 2017-11-19 DIAGNOSIS — J329 Chronic sinusitis, unspecified: Secondary | ICD-10-CM

## 2017-11-19 DIAGNOSIS — Z01419 Encounter for gynecological examination (general) (routine) without abnormal findings: Secondary | ICD-10-CM

## 2017-11-19 NOTE — Assessment & Plan Note (Signed)
Immunizations UTD. Pap UTD, referral placed to new GYN office per patient request. Discussed the importance of a healthy diet and regular exercise in order for weight loss, and to reduce the risk of any potential medical problems. Exam unremarkable. Labs unremarkable. Follow up in 1 year.

## 2017-11-19 NOTE — Progress Notes (Signed)
Subjective:    Patient ID: Gloria Dyer, female    DOB: 07-18-1986, 32 y.o.   MRN: [id number]  HPI  Gloria Dyer is a 32 year old female who presents today for complete physical.  Immunizations: -Tetanus: Completed in 2012 -Influenza: Completed in September 2018   Diet: She endorses a healthy diet Breakfast: Banana, bagel thin Lunch: Applesauce Dinner: Fast food most days of the week, meat, starch. No vegetables.  Snacks: None Desserts: None Beverages: Water, diet soda  Exercise: She is not currently exercising  Eye exam: Completed in Spring 2018 Dental exam: No recent exam Pap Smear: Completed in 2018   Review of Systems  Constitutional: Negative for unexpected weight change.  HENT: Negative for rhinorrhea.   Respiratory: Negative for cough and shortness of breath.   Cardiovascular: Negative for chest pain.  Gastrointestinal: Negative for constipation and diarrhea.  Genitourinary: Negative for difficulty urinating and menstrual problem.  Musculoskeletal: Negative for arthralgias and myalgias.  Skin: Negative for rash.  Allergic/Immunologic: Negative for environmental allergies.  Neurological: Negative for dizziness, numbness and headaches.  Psychiatric/Behavioral:       Denies concerns for anxiety and depression       Past Medical History:  Diagnosis Date  . Endometriosis   . GERD (gastroesophageal reflux disease)   . Hyperlipidemia   . IBS (irritable bowel syndrome)   . Migraines   . 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 Marital status: Married    Spouse name: Not on file  . Number of children: Not on file  . Years of education: Not on file  . Highest education level: Not on file  Social Needs  . Financial resource strain: Not on file  . Food insecurity - worry: Not on file  . Food insecurity - inability: Not on file  . Transportation needs - medical: Not on file  . Transportation needs - non-medical:  Not on file  Occupational History  . Not on file  Tobacco Use  . Smoking status: Never Smoker  . Smokeless tobacco: Former Engineer, waterUser  Substance and Sexual Activity  . Alcohol use: No  . Drug use: Not on file  . Sexual activity: Not on file  Other Topics Concern  . Not on file  Social History Narrative   Single.   No children.   Works as a Runner, broadcasting/film/videoteacher.   Currently in graduate school.      Family History  Problem Relation Age of Onset  . Breast cancer Mother   . Asthma Mother   . Hyperlipidemia Father   . Diabetes Paternal Grandmother   . Diabetes Paternal Grandfather     No Known Allergies  Current Outpatient Medications on File Prior to Visit  Medication Sig Dispense Refill  . gabapentin (NEURONTIN) 600 MG tablet TAKE 1 TABLET (600 MG TOTAL) BY MOUTH AT BEDTIME. 90 tablet 0  . Melatonin 3 MG TABS Take 3 mg by mouth.    . montelukast (SINGULAIR) 10 MG tablet TAKE 1 TABLET (10 MG TOTAL) BY MOUTH AT BEDTIME. 90 tablet 3  . Norethindrone Acetate-Ethinyl Estradiol (JUNEL 1.5/30) 1.5-30 MG-MCG tablet Take by mouth.    . pantoprazole (PROTONIX) 20 MG tablet TAKE 1 TABLET BY MOUTH EVERY DAY 90 tablet 1  . Probiotic Product (ALIGN PO) Take 1 tablet by mouth.    . rosuvastatin (CRESTOR) 5 MG tablet TAKE 1 TABLET (5 MG TOTAL) BY MOUTH DAILY. 90 tablet 0  No current facility-administered medications on file prior to visit.     BP 118/80   Pulse 88   Temp 98.3 F (36.8 C) (Oral)   Ht 5\' 8"  (1.727 m)   Wt 280 lb (127 kg)   SpO2 98%   BMI 42.57 kg/m    Objective:   Physical Exam  Constitutional: She is oriented to person, place, and time. She appears well-nourished.  HENT:  Right Ear: Tympanic membrane and ear canal normal.  Left Ear: Tympanic membrane and ear canal normal.  Nose: Nose normal.  Mouth/Throat: Oropharynx is clear and moist.  Eyes: Conjunctivae and EOM are normal. Pupils are equal, round, and reactive to light.  Neck: Neck supple. No thyromegaly present.    Cardiovascular: Normal rate and regular rhythm.  No murmur heard. Pulmonary/Chest: Effort normal and breath sounds normal. She has no rales.  Abdominal: Soft. Bowel sounds are normal. There is no tenderness.  Musculoskeletal: Normal range of motion.  Lymphadenopathy:    She has no cervical adenopathy.  Neurological: She is alert and oriented to person, place, and time. She has normal reflexes. No cranial nerve deficit.  Skin: Skin is warm and dry. No rash noted.  Psychiatric: She has a normal mood and affect.          Assessment & Plan:

## 2017-11-19 NOTE — Assessment & Plan Note (Signed)
Stable on pantoprazole 20 mg, rarely uses second dose. Continue same.

## 2017-11-19 NOTE — Assessment & Plan Note (Signed)
Stable on Singulair, no recent sinusitis. Continue same.

## 2017-11-19 NOTE — Assessment & Plan Note (Signed)
Managed on gabapentin 600 mg HS for prevention.  Infrequent migraines.  Continue same.

## 2017-11-19 NOTE — Patient Instructions (Addendum)
Start exercising. You should be getting 150 minutes of moderate intensity exercise weekly.  It's important to improve your diet by reducing consumption of fast food, fried food, processed snack foods. Increase consumption of fresh vegetables and fruits, whole grains, water.  Ensure you are drinking 64 ounces of water daily.  Purchase some compression socks for swelling in your feet. Elevate your feet at night when resting.  You will be contacted regarding your referral to GYN.  Please let us know if you have not been contacted within one week.   Follow up in 1 year for your annual exam or sooner if needed.  It was a pleasure to see you today!   Preventive Care 18-39 Years, Female Preventive care refers to lifestyle choices and visits with your health care provider that can promote health and wellness. What does preventive care include?  A yearly physical exam. This is also called an annual well check.  Dental exams once or twice a year.  Routine eye exams. Ask your health care provider how often you should have your eyes checked.  Personal lifestyle choices, including: ? Daily care of your teeth and gums. ? Regular physical activity. ? Eating a healthy diet. ? Avoiding tobacco and drug use. ? Limiting alcohol use. ? Practicing safe sex. ? Taking vitamin and mineral supplements as recommended by your health care provider. What happens during an annual well check? The services and screenings done by your health care provider during your annual well check will depend on your age, overall health, lifestyle risk factors, and family history of disease. Counseling Your health care provider may ask you questions about your:  Alcohol use.  Tobacco use.  Drug use.  Emotional well-being.  Home and relationship well-being.  Sexual activity.  Eating habits.  Work and work Statistician.  Method of birth control.  Menstrual cycle.  Pregnancy history.  Screening You may  have the following tests or measurements:  Height, weight, and BMI.  Diabetes screening. This is done by checking your blood sugar (glucose) after you have not eaten for a while (fasting).  Blood pressure.  Lipid and cholesterol levels. These may be checked every 5 years starting at age 63.  Skin check.  Hepatitis C blood test.  Hepatitis B blood test.  Sexually transmitted disease (STD) testing.  BRCA-related cancer screening. This may be done if you have a family history of breast, ovarian, tubal, or peritoneal cancers.  Pelvic exam and Pap test. This may be done every 3 years starting at age 68. Starting at age 25, this may be done every 5 years if you have a Pap test in combination with an HPV test.  Discuss your test results, treatment options, and if necessary, the need for more tests with your health care provider. Vaccines Your health care provider may recommend certain vaccines, such as:  Influenza vaccine. This is recommended every year.  Tetanus, diphtheria, and acellular pertussis (Tdap, Td) vaccine. You may need a Td booster every 10 years.  Varicella vaccine. You may need this if you have not been vaccinated.  HPV vaccine. If you are 41 or younger, you may need three doses over 6 months.  Measles, mumps, and rubella (MMR) vaccine. You may need at least one dose of MMR. You may also need a second dose.  Pneumococcal 13-valent conjugate (PCV13) vaccine. You may need this if you have certain conditions and were not previously vaccinated.  Pneumococcal polysaccharide (PPSV23) vaccine. You may need one or two doses if  you smoke cigarettes or if you have certain conditions.  Meningococcal vaccine. One dose is recommended if you are age 26-21 years and a first-year college student living in a residence hall, or if you have one of several medical conditions. You may also need additional booster doses.  Hepatitis A vaccine. You may need this if you have certain  conditions or if you travel or work in places where you may be exposed to hepatitis A.  Hepatitis B vaccine. You may need this if you have certain conditions or if you travel or work in places where you may be exposed to hepatitis B.  Haemophilus influenzae type b (Hib) vaccine. You may need this if you have certain risk factors.  Talk to your health care provider about which screenings and vaccines you need and how often you need them. This information is not intended to replace advice given to you by your health care provider. Make sure you discuss any questions you have with your health care provider. Document Released: 12/24/2001 Document Revised: 07/17/2016 Document Reviewed: 08/29/2015 Elsevier Interactive Patient Education  [employer].

## 2017-11-19 NOTE — Assessment & Plan Note (Signed)
Stable on Crestor 5 mg. Recent lipid panel stable, continue Crestor. Discussed the importance of a healthy diet and regular exercise in order for weight loss, and to reduce the risk of any potential medical problems.

## 2017-12-31 ENCOUNTER — Telehealth: Payer: Self-pay | Admitting: Radiology

## 2017-12-31 NOTE — Telephone Encounter (Signed)
Left message for patient to call [email or web address], in regards to a message that she left about trying to reschedule appointment.

## 2018-01-14 ENCOUNTER — Ambulatory Visit (INDEPENDENT_AMBULATORY_CARE_PROVIDER_SITE_OTHER): Payer: BC Managed Care – PPO | Admitting: Family Medicine

## 2018-01-14 ENCOUNTER — Encounter: Payer: Self-pay | Admitting: Family Medicine

## 2018-01-14 VITALS — BP 128/84 | HR 105 | Ht 67.0 in | Wt 280.0 lb

## 2018-01-14 DIAGNOSIS — Z01419 Encounter for gynecological examination (general) (routine) without abnormal findings: Secondary | ICD-10-CM | POA: Diagnosis not present

## 2018-01-14 DIAGNOSIS — Z0000 Encounter for general adult medical examination without abnormal findings: Secondary | ICD-10-CM

## 2018-01-14 DIAGNOSIS — Z793 Long term (current) use of hormonal contraceptives: Secondary | ICD-10-CM

## 2018-01-14 DIAGNOSIS — N809 Endometriosis, unspecified: Secondary | ICD-10-CM | POA: Insufficient documentation

## 2018-01-14 DIAGNOSIS — N912 Amenorrhea, unspecified: Secondary | ICD-10-CM

## 2018-01-14 DIAGNOSIS — Z8349 Family history of other endocrine, nutritional and metabolic diseases: Secondary | ICD-10-CM

## 2018-01-14 MED ORDER — NORETHINDRONE ACET-ETHINYL EST 1.5-30 MG-MCG PO TABS: 1.0000 | ORAL_TABLET | Freq: Every day | ORAL | 5 refills | Status: DC

## 2018-01-14 NOTE — Progress Notes (Signed)
   GYNECOLOGY ANNUAL PREVENTATIVE CARE ENCOUNTER NOTE  Subjective:   Gloria Dyer is a 32 y.o. G0P0000 female here for a routine annual gynecologic exam-establish care. Previously at [facility]. Current complaints: none.   Denies abnormal vaginal bleeding, discharge, pelvic pain, problems with intercourse or other gynecologic concerns.    Gynecologic History No LMP recorded. Patient is not currently having periods (Reason: Oral contraceptives). Contraception: abstinence Last Pap: 2018- WNL Last mammogram: recently due to family history . Results were: abnormal, had f/u US which was normal. No concerns and told next mammogram at 1736-- due to mother, MGM, MAunt  The following portions of the patient's history were reviewed and updated as appropriate: allergies, current medications, past family history, past medical history, past social history, past surgical history and problem list.  Review of Systems Pertinent items are noted in HPI.   Objective:  BP 128/84   Pulse (!) 105   Ht 5\' 7"  (1.702 m)   Wt 280 lb (127 kg)   BMI 43.85 kg/m  Gen: well appearing, NAD HEENT: no scleral icterus CV: RR Lung: Normal WOB Ext: warm well perfused   Assessment and Plan:   1. Family history of thyroid disease - TSH  2. Well woman exam (no gynecological exam) Declined speculum and pelvic exam today - TSH - Norethindrone Acetate-Ethinyl Estradiol (JUNEL 1.5/30) 1.5-30 MG-MCG tablet; Take 1 tablet by mouth daily.  Dispense: 3 Package; Refill: 5  3. Amenorrhea due to oral contraceptive - on continous OCP and "has not had a period since high school.  - US Transvaginal Non-OB; Future  4. Endometriosis Dx by history and confirmed by laproscopic procedure per patient On OCP Will get TVUS to help assess lining of uterus, last was in 2017   Please refer to After Visit Summary for other counseling recommendations.   Return in about 3 months (around 04/16/2018) for f/u US.  Federico FlakeKimberly Niles  Gloria Bordas, MD, MPH, ABFM Attending Physician Faculty Practice- [facility]

## 2018-01-15 ENCOUNTER — Telehealth: Payer: Self-pay | Admitting: Radiology

## 2018-01-15 LAB — TSH: TSH: 0.897 u[IU]/mL (ref 0.450–4.500)

## 2018-01-15 NOTE — Telephone Encounter (Signed)
Left message on the cell phone voicemail, scheduled [facility], patient is aware that I would be calling with appointment time and that I would leave a voicemail if she did not answer.

## 2018-01-20 ENCOUNTER — Ambulatory Visit (HOSPITAL_COMMUNITY): Payer: BC Managed Care – PPO

## 2018-01-26 ENCOUNTER — Other Ambulatory Visit: Payer: Self-pay | Admitting: Primary Care

## 2018-01-26 DIAGNOSIS — G43901 Migraine, unspecified, not intractable, with status migrainosus: Secondary | ICD-10-CM

## 2018-01-27 ENCOUNTER — Other Ambulatory Visit: Payer: Self-pay | Admitting: Primary Care

## 2018-01-27 ENCOUNTER — Encounter: Payer: Self-pay | Admitting: *Deleted

## 2018-01-27 DIAGNOSIS — E785 Hyperlipidemia, unspecified: Secondary | ICD-10-CM

## 2018-02-03 ENCOUNTER — Ambulatory Visit: Payer: BC Managed Care – PPO | Admitting: Primary Care

## 2018-02-03 ENCOUNTER — Encounter: Payer: Self-pay | Admitting: Primary Care

## 2018-02-03 VITALS — BP 120/68 | HR 80 | Temp 98.0°F | Ht 67.5 in | Wt 283.8 lb

## 2018-02-03 DIAGNOSIS — M7502 Adhesive capsulitis of left shoulder: Secondary | ICD-10-CM

## 2018-02-03 NOTE — Patient Instructions (Signed)
Start taking Ibuprofen 600 mg three times daily for the next 5-7 days.  Work on stretching the shoulder as discussed.   Please notify me if no improvement after one week of stretching and Ibuprofen.  It was a pleasure to see you today!   Shoulder Exercises Ask your health care provider which exercises are safe for you. Do exercises exactly as told by your health care provider and adjust them as directed. It is normal to feel mild stretching, pulling, tightness, or discomfort as you do these exercises, but you should stop right away if you feel sudden pain or your pain gets worse.Do not begin these exercises until told by your health care provider. RANGE OF MOTION EXERCISES These exercises warm up your muscles and joints and improve the movement and flexibility of your shoulder. These exercises also help to relieve pain, numbness, and tingling. These exercises involve stretching your injured shoulder directly. Exercise A: Pendulum  1. Stand near a wall or a surface that you can hold onto for balance. 2. Bend at the waist and let your left / right arm hang straight down. Use your other arm to support you. Keep your back straight and do not lock your knees. 3. Relax your left / right arm and shoulder muscles, and move your hips and your trunk so your left / right arm swings freely. Your arm should swing because of the motion of your body, not because you are using your arm or shoulder muscles. 4. Keep moving your body so your arm swings in the following directions, as told by your health care provider: ? Side to side. ? Forward and backward. ? In clockwise and counterclockwise circles. 5. Continue each motion for __________ seconds, or for as long as told by your health care provider. 6. Slowly return to the starting position. Repeat __________ times. Complete this exercise __________ times a day. Exercise B:Flexion, Standing  1. Stand and hold a broomstick, a cane, or a similar object.  Place your hands a little more than shoulder-width apart on the object. Your left / right hand should be palm-up, and your other hand should be palm-down. 2. Keep your elbow straight and keep your shoulder muscles relaxed. Push the stick down with your healthy arm to raise your left / right arm in front of your body, and then over your head until you feel a stretch in your shoulder. ? Avoid shrugging your shoulder while you raise your arm. Keep your shoulder blade tucked down toward the middle of your back. 3. Hold for __________ seconds. 4. Slowly return to the starting position. Repeat __________ times. Complete this exercise __________ times a day. Exercise C: Abduction, Standing 1. Stand and hold a broomstick, a cane, or a similar object. Place your hands a little more than shoulder-width apart on the object. Your left / right hand should be palm-up, and your other hand should be palm-down. 2. While keeping your elbow straight and your shoulder muscles relaxed, push the stick across your body toward your left / right side. Raise your left / right arm to the side of your body and then over your head until you feel a stretch in your shoulder. ? Do not raise your arm above shoulder height, unless your health care provider tells you to do that. ? Avoid shrugging your shoulder while you raise your arm. Keep your shoulder blade tucked down toward the middle of your back. 3. Hold for __________ seconds. 4. Slowly return to the starting position. Repeat  __________ times. Complete this exercise __________ times a day. Exercise D:Internal Rotation  1. Place your left / right hand behind your back, palm-up. 2. Use your other hand to dangle an exercise band, a towel, or a similar object over your shoulder. Grasp the band with your left / right hand so you are holding onto both ends. 3. Gently pull up on the band until you feel a stretch in the front of your left / right shoulder. ? Avoid shrugging your  shoulder while you raise your arm. Keep your shoulder blade tucked down toward the middle of your back. 4. Hold for __________ seconds. 5. Release the stretch by letting go of the band and lowering your hands. Repeat __________ times. Complete this exercise __________ times a day. STRETCHING EXERCISES These exercises warm up your muscles and joints and improve the movement and flexibility of your shoulder. These exercises also help to relieve pain, numbness, and tingling. These exercises are done using your healthy shoulder to help stretch the muscles of your injured shoulder. Exercise E: Research officer, political party (External Rotation and Abduction)  1. Stand in a doorway with one of your feet slightly in front of the other. This is called a staggered stance. If you cannot reach your forearms to the door frame, stand facing a corner of a room. 2. Choose one of the following positions as told by your health care provider: ? Place your hands and forearms on the door frame above your head. ? Place your hands and forearms on the door frame at the height of your head. ? Place your hands on the door frame at the height of your elbows. 3. Slowly move your weight onto your front foot until you feel a stretch across your chest and in the front of your shoulders. Keep your head and chest upright and keep your abdominal muscles tight. 4. Hold for __________ seconds. 5. To release the stretch, shift your weight to your back foot. Repeat __________ times. Complete this stretch __________ times a day. Exercise F:Extension, Standing 1. Stand and hold a broomstick, a cane, or a similar object behind your back. ? Your hands should be a little wider than shoulder-width apart. ? Your palms should face away from your back. 2. Keeping your elbows straight and keeping your shoulder muscles relaxed, move the stick away from your body until you feel a stretch in your shoulder. ? Avoid shrugging your shoulders while you move the  stick. Keep your shoulder blade tucked down toward the middle of your back. 3. Hold for __________ seconds. 4. Slowly return to the starting position. Repeat __________ times. Complete this exercise __________ times a day. STRENGTHENING EXERCISES These exercises build strength and endurance in your shoulder. Endurance is the ability to use your muscles for a long time, even after they get tired. Exercise G:External Rotation  1. Sit in a stable chair without armrests. 2. Secure an exercise band at elbow height on your left / right side. 3. Place a soft object, such as a folded towel or a small pillow, between your left / right upper arm and your body to move your elbow a few inches away (about 10 cm) from your side. 4. Hold the end of the band so it is tight and there is no slack. 5. Keeping your elbow pressed against the soft object, move your left / right forearm out, away from your abdomen. Keep your body steady so only your forearm moves. 6. Hold for __________ seconds. 7. Slowly return  to the starting position. Repeat __________ times. Complete this exercise __________ times a day. Exercise H:Shoulder Abduction  1. Sit in a stable chair without armrests, or stand. 2. Hold a __________ weight in your left / right hand, or hold an exercise band with both hands. 3. Start with your arms straight down and your left / right palm facing in, toward your body. 4. Slowly lift your left / right hand out to your side. Do not lift your hand above shoulder height unless your health care provider tells you that this is safe. ? Keep your arms straight. ? Avoid shrugging your shoulder while you do this movement. Keep your shoulder blade tucked down toward the middle of your back. 5. Hold for __________ seconds. 6. Slowly lower your arm, and return to the starting position. Repeat __________ times. Complete this exercise __________ times a day. Exercise I:Shoulder Extension 1. Sit in a stable chair  without armrests, or stand. 2. Secure an exercise band to a stable object in front of you where it is at shoulder height. 3. Hold one end of the exercise band in each hand. Your palms should face each other. 4. Straighten your elbows and lift your hands up to shoulder height. 5. Step back, away from the secured end of the exercise band, until the band is tight and there is no slack. 6. Squeeze your shoulder blades together as you pull your hands down to the sides of your thighs. Stop when your hands are straight down by your sides. Do not let your hands go behind your body. 7. Hold for __________ seconds. 8. Slowly return to the starting position. Repeat __________ times. Complete this exercise __________ times a day. Exercise J:Standing Shoulder Row 1. Sit in a stable chair without armrests, or stand. 2. Secure an exercise band to a stable object in front of you so it is at waist height. 3. Hold one end of the exercise band in each hand. Your palms should be in a thumbs-up position. 4. Bend each of your elbows to an "L" shape (about 90 degrees) and keep your upper arms at your sides. 5. Step back until the band is tight and there is no slack. 6. Slowly pull your elbows back behind you. 7. Hold for __________ seconds. 8. Slowly return to the starting position. Repeat __________ times. Complete this exercise __________ times a day. Exercise K:Shoulder Press-Ups  1. Sit in a stable chair that has armrests. Sit upright, with your feet flat on the floor. 2. Put your hands on the armrests so your elbows are bent and your fingers are pointing forward. Your hands should be about even with the sides of your body. 3. Push down on the armrests and use your arms to lift yourself off of the chair. Straighten your elbows and lift yourself up as much as you comfortably can. ? Move your shoulder blades down, and avoid letting your shoulders move up toward your ears. ? Keep your feet on the ground. As you  get stronger, your feet should support less of your body weight as you lift yourself up. 4. Hold for __________ seconds. 5. Slowly lower yourself back into the chair. Repeat __________ times. Complete this exercise __________ times a day. Exercise L: Wall Push-Ups  1. Stand so you are facing a stable wall. Your feet should be about one arm-length away from the wall. 2. Lean forward and place your palms on the wall at shoulder height. 3. Keep your feet flat on the  floor as you bend your elbows and lean forward toward the wall. 4. Hold for __________ seconds. 5. Straighten your elbows to push yourself back to the starting position. Repeat __________ times. Complete this exercise __________ times a day. This information is not intended to replace advice given to you by your health care provider. Make sure you discuss any questions you have with your health care provider. Document Released: 09/11/2005 Document Revised: 07/22/2016 Document Reviewed: 07/09/2015 Elsevier Interactive Patient Education  2018 [employer]

## 2018-02-03 NOTE — Progress Notes (Signed)
Subjective:    Patient ID: Gloria Dyer, female    DOB: 1986/05/23, 32 y.o.   MRN: [id number]  HPI  Gloria Dyer is a 32 year old female who presents today with multiple complaints.  1) Extremity Pain: Located to the left upper portion of her left upper extremity and shoulder. She was putting up groceries into her pantry 2 days ago and at that time she noticed pain to her left upper extremity and shoulder with stiffness and weakness. She's noticed increased pain with raising her arm up in different planes of motion and when laying onto the left side. She denies numbness/tingling, injury/trauma.   She took 400 mg of Ibuprofen last night, feeling better today.   2) Cough: Also with headache, body aches, rhinorrhea, post nasal drip. Symptoms began 3 days ago. She denies fevers, productive cough. She's not taken anything for her symptoms.   Review of Systems  Constitutional: Negative for chills and fever.  HENT: Positive for postnasal drip and rhinorrhea. Negative for congestion.   Respiratory: Positive for cough. Negative for shortness of breath.   Cardiovascular: Negative for chest pain.  Musculoskeletal:       Left shoulder and extremity pain  Neurological: Positive for headaches. Negative for numbness.       Past Medical History:  Diagnosis Date  . Endometriosis   . GERD (gastroesophageal reflux disease)   . Hyperlipidemia   . IBS (irritable bowel syndrome)   . Migraines   . 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 Marital status: Married    Spouse name: Not on file  . Number of children: Not on file  . Years of education: Not on file  . Highest education level: Not on file  Occupational History  . Not on file  Social Needs  . Financial resource strain: Not on file  . Food insecurity:    Worry: Not on file    Inability: Not on file  . Transportation needs:    Medical: Not on file    Non-medical: Not on file  Tobacco Use    . Smoking status: Never Smoker  . Smokeless tobacco: Former Engineer, waterUser  Substance and Sexual Activity  . Alcohol use: No  . Drug use: No  . Sexual activity: Never    Partners: Male    Birth control/protection: OCP  Lifestyle  . Physical activity:    Days per week: Not on file    Minutes per session: Not on file  . Stress: Not on file  Relationships  . Social connections:    Talks on phone: Not on file    Gets together: Not on file    Attends religious service: Not on file    Active member of club or organization: Not on file    Attends meetings of clubs or organizations: Not on file    Relationship status: Not on file  . Intimate partner violence:    Fear of current or ex partner: Not on file    Emotionally abused: Not on file    Physically abused: Not on file    Forced sexual activity: Not on file  Other Topics Concern  . Not on file  Social History Narrative   Single.   No children.   Works as a Runner, broadcasting/film/videoteacher.   Currently in graduate school.    Past Surgical History:  Procedure Laterality Date  . ENDOMETRIAL BIOPSY    . TONSILLECTOMY  AND ADENOIDECTOMY      Family History  Problem Relation Age of Onset  . Breast cancer Mother 67       stage 3C  . Asthma Mother   . Thyroid disease Mother   . Graves' disease Mother   . Hyperlipidemia Father   . Stroke Father   . Hypertension Father   . Macular degeneration Father   . Diabetes Paternal Grandmother   . Hyperlipidemia Paternal Grandmother   . Glaucoma Paternal Grandmother   . Alzheimer's disease Paternal Grandmother   . Diabetes Paternal Grandfather   . Hyperlipidemia Paternal Grandfather   . Heart disease Paternal Grandfather   . Hypertension Paternal Grandfather   . Kidney disease Paternal Grandfather   . Macular degeneration Paternal Grandfather   . Eating disorder Sister   . Alzheimer's disease Maternal Grandfather   . Breast cancer Maternal Aunt   . Diabetes Paternal Aunt     No Known Allergies  Current  Outpatient Medications on File Prior to Visit  Medication Sig Dispense Refill  . gabapentin (NEURONTIN) 600 MG tablet TAKE 1 TABLET (600 MG TOTAL) BY MOUTH AT BEDTIME. 90 tablet 1  . Melatonin 3 MG TABS Take 3 mg by mouth.    . montelukast (SINGULAIR) 10 MG tablet TAKE 1 TABLET (10 MG TOTAL) BY MOUTH AT BEDTIME. 90 tablet 3  . Norethindrone Acetate-Ethinyl Estradiol (JUNEL 1.5/30) 1.5-30 MG-MCG tablet Take 1 tablet by mouth daily. 3 Package 5  . pantoprazole (PROTONIX) 20 MG tablet TAKE 1 TABLET BY MOUTH EVERY DAY 90 tablet 1  . Probiotic Product (ALIGN PO) Take 1 tablet by mouth.    . rosuvastatin (CRESTOR) 5 MG tablet TAKE 1 TABLET BY MOUTH EVERY DAY 90 tablet 0   No current facility-administered medications on file prior to visit.     BP 120/68   Pulse 80   Temp 98 F (36.7 C) (Oral)   Ht 5' 7.5" (1.715 m)   Wt 283 lb 12.8 oz (128.7 kg)   SpO2 98%   BMI 43.79 kg/m    Objective:   Physical Exam  HENT:  Mouth/Throat: Oropharynx is clear and moist.  Cardiovascular: Normal rate and regular rhythm.  Pulmonary/Chest: Effort normal and breath sounds normal.  Musculoskeletal:       Left shoulder: She exhibits decreased range of motion and pain. She exhibits no tenderness and no deformity.       Arms: 4/5 strength to left upper extremity, secondary to pain. Moderate decrease in ROM in forward, lateral, posterior abduction.   Skin: Skin is warm and dry.          Assessment & Plan:  Adhesive Capsulitis:  Decreased ROM, left shoulder and extremity pain x 2 days. Exam today representative of adhesive capsulitis.  Discussed to start Ibuprofen 600 mg TID with food x 5-7 days. Handout for stretching exercises provided. Also discussed heat/ice. She'll update if no improvement in 1 week.  Doreene Nest, NP

## 2018-02-05 ENCOUNTER — Encounter: Payer: Self-pay | Admitting: Primary Care

## 2018-02-06 ENCOUNTER — Telehealth: Payer: Self-pay | Admitting: Primary Care

## 2018-02-06 DIAGNOSIS — J019 Acute sinusitis, unspecified: Secondary | ICD-10-CM

## 2018-02-06 MED ORDER — FLUTICASONE PROPIONATE 50 MCG/ACT NA SUSP: 1.0000 | Freq: Two times a day (BID) | NASAL | 0 refills | Status: DC

## 2018-02-06 MED ORDER — AZITHROMYCIN 250 MG PO TABS: ORAL_TABLET | ORAL | 0 refills | Status: DC

## 2018-02-06 NOTE — Telephone Encounter (Signed)
Copied from CRM [id number]. Topic: Quick Communication - See Telephone Encounter >> Feb 06, 2018  3:32 PM Oneal GroutSebastian, Jennifer S wrote: CRM for notification. See Telephone encounter for: 02/06/18. Patient checking status of provider calling something in, please mychart message from today, [facility] uses [facility] [address]

## 2018-02-06 NOTE — Telephone Encounter (Signed)
Please see pt email on 02/05/18. Pt is willing to try round of prednisone[facility] [address]

## 2018-02-06 NOTE — Telephone Encounter (Signed)
Spoke with patient via phone, she's expelling thick green mucous from her nasal cavity, cough has increased, dental pain that will wake her from sleep. Rx for Azithromycin course and Flonase sent to pharmacy. She'll update Monday next week.

## 2018-02-23 ENCOUNTER — Encounter: Payer: Self-pay | Admitting: Primary Care

## 2018-02-23 DIAGNOSIS — K2100 Gastro-esophageal reflux disease with esophagitis, without bleeding: Secondary | ICD-10-CM

## 2018-02-23 MED ORDER — RANITIDINE HCL 150 MG PO TABS: 150.0000 mg | ORAL_TABLET | Freq: Every day | ORAL | 0 refills | Status: DC

## 2018-02-25 ENCOUNTER — Ambulatory Visit: Payer: BC Managed Care – PPO | Admitting: Primary Care

## 2018-02-25 DIAGNOSIS — Z2089 Contact with and (suspected) exposure to other communicable diseases: Secondary | ICD-10-CM

## 2018-02-28 ENCOUNTER — Other Ambulatory Visit: Payer: Self-pay | Admitting: Primary Care

## 2018-02-28 DIAGNOSIS — J019 Acute sinusitis, unspecified: Secondary | ICD-10-CM

## 2018-03-02 ENCOUNTER — Encounter: Payer: Self-pay | Admitting: Radiology

## 2018-04-16 ENCOUNTER — Other Ambulatory Visit: Payer: Self-pay | Admitting: Primary Care

## 2018-04-16 DIAGNOSIS — K219 Gastro-esophageal reflux disease without esophagitis: Secondary | ICD-10-CM

## 2018-04-28 ENCOUNTER — Other Ambulatory Visit: Payer: Self-pay | Admitting: Primary Care

## 2018-04-28 DIAGNOSIS — E785 Hyperlipidemia, unspecified: Secondary | ICD-10-CM

## 2018-05-01 ENCOUNTER — Encounter: Payer: Self-pay | Admitting: Primary Care

## 2018-05-07 ENCOUNTER — Encounter: Payer: Self-pay | Admitting: *Deleted

## 2018-05-07 ENCOUNTER — Ambulatory Visit: Payer: BC Managed Care – PPO | Admitting: Family Medicine

## 2018-05-07 ENCOUNTER — Encounter: Payer: Self-pay | Admitting: Family Medicine

## 2018-05-07 VITALS — BP 134/78 | HR 89 | Temp 98.1°F | Ht 68.0 in | Wt 281.8 lb

## 2018-05-07 DIAGNOSIS — R197 Diarrhea, unspecified: Secondary | ICD-10-CM | POA: Diagnosis not present

## 2018-05-07 DIAGNOSIS — Z831 Family history of other infectious and parasitic diseases: Secondary | ICD-10-CM

## 2018-05-07 DIAGNOSIS — K580 Irritable bowel syndrome with diarrhea: Secondary | ICD-10-CM | POA: Diagnosis not present

## 2018-05-07 DIAGNOSIS — Z8379 Family history of other diseases of the digestive system: Secondary | ICD-10-CM

## 2018-05-07 LAB — COMPREHENSIVE METABOLIC PANEL
ALBUMIN: 3.7 g/dL (ref 3.5–5.2)
ALT: 13 U/L (ref 0–35)
AST: 15 U/L (ref 0–37)
Alkaline Phosphatase: 74 U/L (ref 39–117)
BUN: 10 mg/dL (ref 6–23)
CHLORIDE: 106 meq/L (ref 96–112)
CO2: 29 mEq/L (ref 19–32)
Calcium: 8.8 mg/dL (ref 8.4–10.5)
Creatinine, Ser: 0.66 mg/dL (ref 0.40–1.20)
GFR: 110.48 mL/min (ref >60.00)
GLUCOSE: 92 mg/dL (ref 70–99)
Potassium: 3.8 mEq/L (ref 3.5–5.1)
SODIUM: 139 meq/L (ref 135–145)
Total Bilirubin: 0.4 mg/dL (ref 0.2–1.2)
Total Protein: 7 g/dL (ref 6.0–8.3)

## 2018-05-07 LAB — CBC WITH DIFFERENTIAL/PLATELET
Basophils Absolute: 0 10*3/uL (ref 0.0–0.1)
Basophils Relative: 0.4 % (ref 0.0–3.0)
Eosinophils Absolute: 0.1 10*3/uL (ref 0.0–0.7)
Eosinophils Relative: 1.1 % (ref 0.0–5.0)
HCT: 38 % (ref 36.0–46.0)
HEMOGLOBIN: 12.6 g/dL (ref 12.0–15.0)
Lymphocytes Relative: 42.5 % (ref 12.0–46.0)
Lymphs Abs: 3.9 10*3/uL (ref 0.7–4.0)
MCHC: 33.1 g/dL (ref 30.0–36.0)
MCV: 83.1 fl (ref 78.0–100.0)
MONO ABS: 0.5 10*3/uL (ref 0.1–1.0)
Monocytes Relative: 5.4 % (ref 3.0–12.0)
NEUTROS PCT: 50.6 % (ref 43.0–77.0)
Neutro Abs: 4.7 10*3/uL (ref 1.4–7.7)
Platelets: 290 10*3/uL (ref 150.0–400.0)
RBC: 4.58 Mil/uL (ref 3.87–5.11)
RDW: 14.1 % (ref 11.5–15.5)
WBC: 9.2 10*3/uL (ref 4.0–10.5)

## 2018-05-07 MED ORDER — DICYCLOMINE HCL 10 MG PO CAPS: 10.0000 mg | ORAL_CAPSULE | Freq: Three times a day (TID) | ORAL | 0 refills | Status: DC

## 2018-05-07 NOTE — Patient Instructions (Addendum)
Please stop at the lab to have labs drawn.  Can use bentyl for abdominal cramping.  Call if not improving overtime.  Call if not improving as expected, new fever or blood in stool.

## 2018-05-07 NOTE — Progress Notes (Signed)
   Subjective:    Patient ID: Gloria Dyer, female    DOB: [birth year], 32 y.o.   MRN: [id number]  HPI   32 year old female with history of  IBS, GERD presents with abdominal cramps and loose stools in the last 1.5 weeks.   Started new diet  (added grapes and more salad both days)1.5 week ago.. Day after had cramps, diarrhea. Pain low down, occ up high or in middle on left. Progressed for 48 hours. No emesis. Improved some until early this weeks.  Symptoms start after eating. Orange color stool.  Undigested lettuce in stool. Abdominal cramping improved after BM. Increased gurgling in stomach. Some explosive gassy stools.  No blood in stools.  no fever.  No sick contacts,  Drinks bottled water, no trips.   Aunt has autoimmune hepatitis.   DX with IBS 4 years ago, has not had flares in last 2 years.  Social History /Family History/Past Medical History reviewed in detail and updated in EMR if needed. Blood pressure 134/78, pulse 89, temperature 98.1 F (36.7 C), temperature source Oral, height 5\' 8"  (1.727 m), weight 281 lb 12 oz (127.8 kg), SpO2 98 %.   Review of Systems  Constitutional: Negative for fatigue and fever.  HENT: Negative for ear pain.   Eyes: Negative for pain.  Respiratory: Negative for chest tightness and shortness of breath.   Cardiovascular: Negative for chest pain, palpitations and leg swelling.  Gastrointestinal: Positive for abdominal pain and diarrhea.  Genitourinary: Negative for dysuria.       Objective:   Physical Exam  Constitutional: Vital signs are normal. She appears well-developed and well-nourished. She is cooperative.  Non-toxic appearance. She does not appear ill. No distress.  HENT:  Head: Normocephalic.  Right Ear: Hearing, tympanic membrane, external ear and ear canal normal. Tympanic membrane is not erythematous, not retracted and not bulging.  Left Ear: Hearing, tympanic membrane, external ear and ear canal normal. Tympanic membrane is  not erythematous, not retracted and not bulging.  Nose: No mucosal edema or rhinorrhea. Right sinus exhibits no maxillary sinus tenderness and no frontal sinus tenderness. Left sinus exhibits no maxillary sinus tenderness and no frontal sinus tenderness.  Mouth/Throat: Uvula is midline, oropharynx is clear and moist and mucous membranes are normal.  Eyes: Pupils are equal, round, and reactive to light. Conjunctivae, EOM and lids are normal. Lids are everted and swept, no foreign bodies found.  Neck: Trachea normal and normal range of motion. Neck supple. Carotid bruit is not present. No thyroid mass and no thyromegaly present.  Cardiovascular: Normal rate, regular rhythm, S1 normal, S2 normal, normal heart sounds, intact distal pulses and normal pulses. Exam reveals no gallop and no friction rub.  No murmur heard. Pulmonary/Chest: Effort normal and breath sounds normal. No tachypnea. No respiratory distress. She has no decreased breath sounds. She has no wheezes. She has no rhonchi. She has no rales.  Abdominal: Soft. Normal appearance and bowel sounds are normal. There is generalized tenderness.  Neurological: She is alert.  Skin: Skin is warm, dry and intact. No rash noted.  Psychiatric: Her speech is normal and behavior is normal. Judgment and thought content normal. Her mood appears not anxious. Cognition and memory are normal. She does not exhibit a depressed mood.          Assessment & Plan:

## 2018-05-07 NOTE — Assessment & Plan Note (Signed)
Likely due to IBS flare. No red flags or suggestion of infectious cause. Will check cbc.  Treat with bentyl.  Given pt desire for eval of liver with aunts recent Dx of autoimmune hep.. eval with [facility].

## 2018-06-06 ENCOUNTER — Encounter: Payer: Self-pay | Admitting: Primary Care

## 2018-07-25 ENCOUNTER — Other Ambulatory Visit: Payer: Self-pay | Admitting: Primary Care

## 2018-07-25 DIAGNOSIS — G43901 Migraine, unspecified, not intractable, with status migrainosus: Secondary | ICD-10-CM

## 2018-09-01 ENCOUNTER — Ambulatory Visit: Payer: BC Managed Care – PPO | Admitting: Primary Care

## 2018-09-01 ENCOUNTER — Encounter: Payer: Self-pay | Admitting: Primary Care

## 2018-09-01 DIAGNOSIS — J029 Acute pharyngitis, unspecified: Secondary | ICD-10-CM | POA: Diagnosis not present

## 2018-09-01 LAB — POCT RAPID STREP A (OFFICE): Rapid Strep A Screen: NEGATIVE

## 2018-09-01 MED ORDER — LIDOCAINE VISCOUS HCL 2 % MT SOLN: OROMUCOSAL | 0 refills | Status: DC

## 2018-09-01 NOTE — Patient Instructions (Addendum)
Your symptoms are representative of a viral illness which will resolve on its own over time. Our goal is to treat your symptoms in order to aid your body in the healing process and to make you more comfortable.   Take Tylenol or Ibuprofen for sore throat and headaches.  Start an antihistamine daily for drainage, take this at bedtime. (Zyrtec, Allegra, Claritin).  You can try the viscous lidocaine every 4-6 hours as needed. Gargle and swallow 15 mls every 4-6 hours.   Please call me Friday this week if you feel worse, no improvement, or if you start to run fevers.   It was a pleasure to see you today!

## 2018-09-01 NOTE — Progress Notes (Signed)
Subjective:    Patient ID: Gloria Dyer, female    DOB: Nov 20, 1985, 32 y.o.   MRN: [id number]  HPI  Gloria Dyer is a 32 year old female with a history of recurrent sinusitis and GERD who presents today with a chief complaint of sore throat.   She also reports cough in the evening, headache. Her symptoms began 8 days ago. She's had a low grade fever one day last week. She's been taking Ibuprofen which helps with headaches but not sore throat. She's been drinking hot cider with temporary relief.   Review of Systems  Constitutional: Negative for fever.  HENT: Positive for sore throat. Negative for congestion, ear pain and sinus pressure.   Respiratory: Positive for cough. Negative for shortness of breath.   Cardiovascular: Negative for chest pain.  Neurological: Positive for headaches.       Past Medical History:  Diagnosis Date  . Endometriosis   . GERD (gastroesophageal reflux disease)   . Hyperlipidemia   . IBS (irritable bowel syndrome)   . Migraines   . 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 Marital status: Married    Spouse name: Not on file  . Number of children: Not on file  . Years of education: Not on file  . Highest education level: Not on file  Occupational History  . Not on file  Social Needs  . Financial resource strain: Not on file  . Food insecurity:    Worry: Not on file    Inability: Not on file  . Transportation needs:    Medical: Not on file    Non-medical: Not on file  Tobacco Use  . Smoking status: Never Smoker  . Smokeless tobacco: Never Used  Substance and Sexual Activity  . Alcohol use: No  . Drug use: No  . Sexual activity: Never    Partners: Male    Birth control/protection: OCP  Lifestyle  . Physical activity:    Days per week: Not on file    Minutes per session: Not on file  . Stress: Not on file  Relationships  . Social connections:    Talks on phone: Not on file    Gets together:  Not on file    Attends religious service: Not on file    Active member of club or organization: Not on file    Attends meetings of clubs or organizations: Not on file    Relationship status: Not on file  . Intimate partner violence:    Fear of current or ex partner: Not on file    Emotionally abused: Not on file    Physically abused: Not on file    Forced sexual activity: Not on file  Other Topics Concern  . Not on file  Social History Narrative   Single.   No children.   Works as a Runner, broadcasting/film/video.   Currently in graduate school.    Past Surgical History:  Procedure Laterality Date  . ENDOMETRIAL BIOPSY    . TONSILLECTOMY AND ADENOIDECTOMY      Family History  Problem Relation Age of Onset  . Breast cancer Mother 74       stage 3C  . Asthma Mother   . Thyroid disease Mother   . Graves' disease Mother   . Hyperlipidemia Father   . Stroke Father   . Hypertension Father   . Macular degeneration Father   . Diabetes Paternal Grandmother   .  Hyperlipidemia Paternal Grandmother   . Glaucoma Paternal Grandmother   . Alzheimer's disease Paternal Grandmother   . Diabetes Paternal Grandfather   . Hyperlipidemia Paternal Grandfather   . Heart disease Paternal Grandfather   . Hypertension Paternal Grandfather   . Kidney disease Paternal Grandfather   . Macular degeneration Paternal Grandfather   . Eating disorder Sister   . Alzheimer's disease Maternal Grandfather   . Breast cancer Maternal Aunt   . Diabetes Paternal Aunt     No Known Allergies  Current Outpatient Medications on File Prior to Visit  Medication Sig Dispense Refill  . gabapentin (NEURONTIN) 600 MG tablet TAKE 1 TABLET (600 MG TOTAL) BY MOUTH AT BEDTIME. 90 tablet 0  . Melatonin 3 MG TABS Take 3 mg by mouth.    . montelukast (SINGULAIR) 10 MG tablet TAKE 1 TABLET (10 MG TOTAL) BY MOUTH AT BEDTIME. 90 tablet 3  . Norethindrone Acetate-Ethinyl Estradiol (JUNEL 1.5/30) 1.5-30 MG-MCG tablet Take 1 tablet by mouth  daily. 3 Package 5  . pantoprazole (PROTONIX) 20 MG tablet TAKE 1 TABLET BY MOUTH EVERY DAY 90 tablet 1  . Probiotic Product (ALIGN PO) Take 1 tablet by mouth.    . ranitidine (ZANTAC) 150 MG tablet Take 1 tablet (150 mg total) by mouth at bedtime. 90 tablet 0  . rosuvastatin (CRESTOR) 5 MG tablet TAKE 1 TABLET BY MOUTH EVERY DAY 90 tablet 1  . dicyclomine (BENTYL) 10 MG capsule Take 1 capsule (10 mg total) by mouth 4 (four) times daily -  before meals and at bedtime. (Patient not taking: Reported on 09/01/2018) 20 capsule 0  . fluticasone (FLONASE) 50 MCG/ACT nasal spray PLACE 1 SPRAY INTO BOTH NOSTRILS 2 (TWO) TIMES DAILY. (Patient not taking: Reported on 05/07/2018) 16 g 0   No current facility-administered medications on file prior to visit.     BP 122/78 (BP Location: Right Arm, Patient Position: Sitting, Cuff Size: Large)   Pulse 93   Temp 97.6 F (36.4 C) (Oral)   Ht 5\' 8"  (1.727 m)   Wt 279 lb 12.8 oz (126.9 kg)   SpO2 97%   BMI 42.54 kg/m    Objective:   Physical Exam  Constitutional: She appears well-nourished. She does not appear ill.  HENT:  Right Ear: Tympanic membrane and ear canal normal.  Left Ear: Tympanic membrane and ear canal normal.  Nose: No mucosal edema. Right sinus exhibits no maxillary sinus tenderness and no frontal sinus tenderness. Left sinus exhibits no maxillary sinus tenderness and no frontal sinus tenderness.  Mouth/Throat: Oropharynx is clear and moist.  Neck: Neck supple.  Cardiovascular: Normal rate and regular rhythm.  Respiratory: Effort normal and breath sounds normal. She has no wheezes.  Skin: Skin is warm and dry.           Assessment & Plan:  Sore Throat:  Present for the last 8 days, low grade fever for one day. Exam today with some lymphadenopathy to bilateral cervical chain, throat appears unremarkable.  Rapid strep negative. Throat culture pending. Rx for viscous lidocaine sent to pharmacy. Start antihistamine. Continue  ibuprofen.  She will update later this week if no improvement.  Doreene Nest, NP

## 2018-09-01 NOTE — Progress Notes (Signed)
b bn

## 2018-09-01 NOTE — Addendum Note (Signed)
Addended by: Zollie Pee A on: 09/01/2018 09:56 AM   Modules accepted: Orders

## 2018-09-03 LAB — CULTURE, GROUP A STREP
MICRO NUMBER: 91268841
SPECIMEN QUALITY: ADEQUATE

## 2018-09-04 ENCOUNTER — Ambulatory Visit: Payer: BC Managed Care – PPO | Admitting: Medical

## 2018-10-16 ENCOUNTER — Other Ambulatory Visit: Payer: Self-pay | Admitting: Primary Care

## 2018-10-16 DIAGNOSIS — E785 Hyperlipidemia, unspecified: Secondary | ICD-10-CM

## 2018-10-16 DIAGNOSIS — K219 Gastro-esophageal reflux disease without esophagitis: Secondary | ICD-10-CM

## 2018-10-23 ENCOUNTER — Other Ambulatory Visit: Payer: Self-pay | Admitting: Primary Care

## 2018-10-23 DIAGNOSIS — G43901 Migraine, unspecified, not intractable, with status migrainosus: Secondary | ICD-10-CM

## 2018-10-26 ENCOUNTER — Other Ambulatory Visit: Payer: Self-pay | Admitting: Primary Care

## 2018-10-26 DIAGNOSIS — J329 Chronic sinusitis, unspecified: Secondary | ICD-10-CM

## 2018-11-05 ENCOUNTER — Other Ambulatory Visit: Payer: Self-pay | Admitting: Primary Care

## 2018-11-05 DIAGNOSIS — E785 Hyperlipidemia, unspecified: Secondary | ICD-10-CM

## 2018-11-12 ENCOUNTER — Other Ambulatory Visit (INDEPENDENT_AMBULATORY_CARE_PROVIDER_SITE_OTHER): Payer: BC Managed Care – PPO

## 2018-11-12 DIAGNOSIS — E785 Hyperlipidemia, unspecified: Secondary | ICD-10-CM | POA: Diagnosis not present

## 2018-11-12 LAB — BASIC METABOLIC PANEL
BUN: 13 mg/dL (ref 6–23)
CHLORIDE: 104 meq/L (ref 96–112)
CO2: 25 mEq/L (ref 19–32)
Calcium: 8.9 mg/dL (ref 8.4–10.5)
Creatinine, Ser: 0.78 mg/dL (ref 0.40–1.20)
GFR: 90.81 mL/min (ref >60.00)
Glucose, Bld: 95 mg/dL (ref 70–99)
Potassium: 3.8 mEq/L (ref 3.5–5.1)
Sodium: 138 mEq/L (ref 135–145)

## 2018-11-12 LAB — LIPID PANEL
CHOL/HDL RATIO: 4
Cholesterol: 151 mg/dL (ref 0–200)
HDL: 36.9 mg/dL — ABNORMAL LOW (ref >39.00)
LDL Cholesterol: 89 mg/dL (ref 0–99)
NonHDL: 114.42
Triglycerides: 128 mg/dL (ref 0.0–149.0)
VLDL: 25.6 mg/dL (ref 0.0–40.0)

## 2018-11-24 ENCOUNTER — Encounter: Payer: Self-pay | Admitting: Radiology

## 2018-11-25 ENCOUNTER — Encounter: Payer: BC Managed Care – PPO | Admitting: Primary Care

## 2018-11-25 DIAGNOSIS — Z0289 Encounter for other administrative examinations: Secondary | ICD-10-CM

## 2018-12-11 ENCOUNTER — Encounter: Payer: Self-pay | Admitting: Primary Care

## 2018-12-11 ENCOUNTER — Ambulatory Visit (INDEPENDENT_AMBULATORY_CARE_PROVIDER_SITE_OTHER): Payer: BC Managed Care – PPO | Admitting: Primary Care

## 2018-12-11 VITALS — BP 122/72 | HR 88 | Temp 97.7°F | Ht 68.0 in | Wt 270.2 lb

## 2018-12-11 DIAGNOSIS — J019 Acute sinusitis, unspecified: Secondary | ICD-10-CM

## 2018-12-11 DIAGNOSIS — E785 Hyperlipidemia, unspecified: Secondary | ICD-10-CM | POA: Diagnosis not present

## 2018-12-11 DIAGNOSIS — G43901 Migraine, unspecified, not intractable, with status migrainosus: Secondary | ICD-10-CM | POA: Diagnosis not present

## 2018-12-11 DIAGNOSIS — Z0000 Encounter for general adult medical examination without abnormal findings: Secondary | ICD-10-CM | POA: Diagnosis not present

## 2018-12-11 DIAGNOSIS — N809 Endometriosis, unspecified: Secondary | ICD-10-CM

## 2018-12-11 DIAGNOSIS — K219 Gastro-esophageal reflux disease without esophagitis: Secondary | ICD-10-CM

## 2018-12-11 DIAGNOSIS — J329 Chronic sinusitis, unspecified: Secondary | ICD-10-CM

## 2018-12-11 MED ORDER — CYCLOBENZAPRINE HCL 5 MG PO TABS: 5.0000 mg | ORAL_TABLET | Freq: Every day | ORAL | 0 refills | Status: DC

## 2018-12-11 MED ORDER — GABAPENTIN 600 MG PO TABS: 600.0000 mg | ORAL_TABLET | Freq: Every day | ORAL | 3 refills | Status: DC

## 2018-12-11 MED ORDER — ROSUVASTATIN CALCIUM 5 MG PO TABS: 5.0000 mg | ORAL_TABLET | Freq: Every day | ORAL | 3 refills | Status: DC

## 2018-12-11 NOTE — Assessment & Plan Note (Signed)
Following with GYN, overall no recent flares. Continue same.

## 2018-12-11 NOTE — Assessment & Plan Note (Signed)
Immunizations UTD. Pap smear UTD, follows with GYN. Recommended to get regular exercise, work on diet. Exam unremarkable. Labs reviewed. Follow up in 1 year for CPE.

## 2018-12-11 NOTE — Assessment & Plan Note (Signed)
Doing well on pantoprazole 20 mg once daily mostly, will sometimes take twice daily if needed. Continue same, continue to monitor.

## 2018-12-11 NOTE — Assessment & Plan Note (Signed)
No recent infections.  Continue to monitor.

## 2018-12-11 NOTE — Progress Notes (Signed)
Subjective:    Patient ID: Gloria Dyer, female    DOB: 1986-04-06, 33 y.o.   MRN: [id number]  HPI  Gloria Dyer is a 33 year old female who presents today for complete physical.  Immunizations: -Tetanus: Completed in 2012 -Influenza: Completed this season    Diet: She endorses a health diet Breakfast: Pancake/muffin, fruit Lunch: Pasta, chicken, fruit Dinner: Pasta, chicken, fruit Snacks: None Desserts: None Beverages: Water, diet soda, diet sweet tea  Exercise: She is not exercising  Eye exam: Completed in 2019 Dental exam: Completes semi-annually  Pap Smear: Completed in 2018, follows with GYN   Review of Systems  Constitutional: Negative for unexpected weight change.  HENT: Negative for rhinorrhea.   Respiratory: Negative for cough and shortness of breath.   Cardiovascular: Negative for chest pain.  Gastrointestinal: Negative for constipation and diarrhea.  Genitourinary: Negative for difficulty urinating.  Musculoskeletal: Negative for arthralgias and myalgias.  Skin: Negative for rash.  Allergic/Immunologic: Negative for environmental allergies.  Neurological: Positive for headaches. Negative for dizziness and numbness.  Psychiatric/Behavioral: The patient is not nervous/anxious.        Past Medical History:  Diagnosis Date  . Endometriosis   . GERD (gastroesophageal reflux disease)   . Hyperlipidemia   . IBS (irritable bowel syndrome)   . Migraines   . 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 Marital status: Married    Spouse name: Not on file  . Number of children: Not on file  . Years of education: Not on file  . Highest education level: Not on file  Occupational History  . Not on file  Social Needs  . Financial resource strain: Not on file  . Food insecurity:    Worry: Not on file    Inability: Not on file  . Transportation needs:    Medical: Not on file    Non-medical: Not on file  Tobacco Use    . Smoking status: Never Smoker  . Smokeless tobacco: Never Used  Substance and Sexual Activity  . Alcohol use: No  . Drug use: No  . Sexual activity: Never    Partners: Male    Birth control/protection: OCP  Lifestyle  . Physical activity:    Days per week: Not on file    Minutes per session: Not on file  . Stress: Not on file  Relationships  . Social connections:    Talks on phone: Not on file    Gets together: Not on file    Attends religious service: Not on file    Active member of club or organization: Not on file    Attends meetings of clubs or organizations: Not on file    Relationship status: Not on file  . Intimate partner violence:    Fear of current or ex partner: Not on file    Emotionally abused: Not on file    Physically abused: Not on file    Forced sexual activity: Not on file  Other Topics Concern  . Not on file  Social History Narrative   Single.   No children.   Works as a Runner, broadcasting/film/videoteacher.   Currently in graduate school.    Past Surgical History:  Procedure Laterality Date  . ENDOMETRIAL BIOPSY    . TONSILLECTOMY AND ADENOIDECTOMY      Family History  Problem Relation Age of Onset  . Breast cancer Mother 2549  stage 3C  . Asthma Mother   . Thyroid disease Mother   . Graves' disease Mother   . Hyperlipidemia Father   . Stroke Father   . Hypertension Father   . Macular degeneration Father   . Diabetes Paternal Grandmother   . Hyperlipidemia Paternal Grandmother   . Glaucoma Paternal Grandmother   . Alzheimer's disease Paternal Grandmother   . Diabetes Paternal Grandfather   . Hyperlipidemia Paternal Grandfather   . Heart disease Paternal Grandfather   . Hypertension Paternal Grandfather   . Kidney disease Paternal Grandfather   . Macular degeneration Paternal Grandfather   . Eating disorder Sister   . Alzheimer's disease Maternal Grandfather   . Breast cancer Maternal Aunt   . Diabetes Paternal Aunt     No Known Allergies  Current  Outpatient Medications on File Prior to Visit  Medication Sig Dispense Refill  . dicyclomine (BENTYL) 10 MG capsule Take 1 capsule (10 mg total) by mouth 4 (four) times daily -  before meals and at bedtime. 20 capsule 0  . fluticasone (FLONASE) 50 MCG/ACT nasal spray PLACE 1 SPRAY INTO BOTH NOSTRILS 2 (TWO) TIMES DAILY. 16 g 0  . lidocaine (XYLOCAINE) 2 % solution Gargle and swallow 15 ml every 4-6 hours as needed for throat pain. 180 mL 0  . Melatonin 3 MG TABS Take 3 mg by mouth.    . montelukast (SINGULAIR) 10 MG tablet TAKE 1 TABLET BY MOUTH EVERYDAY AT BEDTIME 90 tablet 0  . Norethindrone Acetate-Ethinyl Estradiol (JUNEL 1.5/30) 1.5-30 MG-MCG tablet Take 1 tablet by mouth daily. 3 Package 5  . pantoprazole (PROTONIX) 20 MG tablet TAKE 1 TABLET BY MOUTH EVERY DAY 90 tablet 1  . Probiotic Product (ALIGN PO) Take 1 tablet by mouth.     No current facility-administered medications on file prior to visit.     BP 122/72   Pulse 88   Temp 97.7 F (36.5 C) (Oral)   Ht 5\' 8"  (1.727 m)   Wt 270 lb 4 oz (122.6 kg)   SpO2 98%   BMI 41.09 kg/m    Objective:   Physical Exam  Constitutional: She is oriented to person, place, and time. She appears well-nourished.  HENT:  Mouth/Throat: No oropharyngeal exudate.  Eyes: Pupils are equal, round, and reactive to light. EOM are normal.  Neck: Neck supple. No thyromegaly present.  Cardiovascular: Normal rate and regular rhythm.  Respiratory: Effort normal and breath sounds normal.  GI: Soft. Bowel sounds are normal. There is no abdominal tenderness.  Musculoskeletal: Normal range of motion.  Neurological: She is alert and oriented to person, place, and time.  Skin: Skin is warm and dry.  Psychiatric: She has a normal mood and affect.           Assessment & Plan:

## 2018-12-11 NOTE — Assessment & Plan Note (Addendum)
More headaches due to increased stress at work. Ibuprofen is effective overall. Last migraine was one week ago, infrequent overall.  Continue gabapentin HS for prevention, has no abortive treatment but did well on Flexeril in the past. Rx for Flexeril and gabapentin sent to pharmacy.

## 2018-12-11 NOTE — Patient Instructions (Signed)
Start exercising. You should be getting 150 minutes of moderate intensity exercise weekly.  Increase consumption of vegetables, fruit, whole grains, lean protein.  Ensure you are consuming 64 ounces of water daily.  Follow up with gynecology as discussed.  You may use the cyclobenzaprine as needed for migraines. Take 1-2 tablets at bedtime as needed. This will make you drowsy.  We will see you in one year for your annual exam or sooner if needed. It was a pleasure to see you today!   Preventive Care 18-39 Years, Female Preventive care refers to lifestyle choices and visits with your health care provider that can promote health and wellness. What does preventive care include?   A yearly physical exam. This is also called an annual well check.  Dental exams once or twice a year.  Routine eye exams. Ask your health care provider how often you should have your eyes checked.  Personal lifestyle choices, including: ? Daily care of your teeth and gums. ? Regular physical activity. ? Eating a healthy diet. ? Avoiding tobacco and drug use. ? Limiting alcohol use. ? Practicing safe sex. ? Taking vitamin and mineral supplements as recommended by your health care provider. What happens during an annual well check? The services and screenings done by your health care provider during your annual well check will depend on your age, overall health, lifestyle risk factors, and family history of disease. Counseling Your health care provider may ask you questions about your:  Alcohol use.  Tobacco use.  Drug use.  Emotional well-being.  Home and relationship well-being.  Sexual activity.  Eating habits.  Work and work Statistician.  Method of birth control.  Menstrual cycle.  Pregnancy history. Screening You may have the following tests or measurements:  Height, weight, and BMI.  Diabetes screening. This is done by checking your blood sugar (glucose) after you have not  eaten for a while (fasting).  Blood pressure.  Lipid and cholesterol levels. These may be checked every 5 years starting at age 7.  Skin check.  Hepatitis C blood test.  Hepatitis B blood test.  Sexually transmitted disease (STD) testing.  BRCA-related cancer screening. This may be done if you have a family history of breast, ovarian, tubal, or peritoneal cancers.  Pelvic exam and Pap test. This may be done every 3 years starting at age 82. Starting at age 14, this may be done every 5 years if you have a Pap test in combination with an HPV test. Discuss your test results, treatment options, and if necessary, the need for more tests with your health care provider. Vaccines Your health care provider may recommend certain vaccines, such as:  Influenza vaccine. This is recommended every year.  Tetanus, diphtheria, and acellular pertussis (Tdap, Td) vaccine. You may need a Td booster every 10 years.  Varicella vaccine. You may need this if you have not been vaccinated.  HPV vaccine. If you are 71 or younger, you may need three doses over 6 months.  Measles, mumps, and rubella (MMR) vaccine. You may need at least one dose of MMR. You may also need a second dose.  Pneumococcal 13-valent conjugate (PCV13) vaccine. You may need this if you have certain conditions and were not previously vaccinated.  Pneumococcal polysaccharide (PPSV23) vaccine. You may need one or two doses if you smoke cigarettes or if you have certain conditions.  Meningococcal vaccine. One dose is recommended if you are age 52-21 years and a first-year college student living in a residence  hall, or if you have one of several medical conditions. You may also need additional booster doses.  Hepatitis A vaccine. You may need this if you have certain conditions or if you travel or work in places where you may be exposed to hepatitis A.  Hepatitis B vaccine. You may need this if you have certain conditions or if you  travel or work in places where you may be exposed to hepatitis B.  Haemophilus influenzae type b (Hib) vaccine. You may need this if you have certain risk factors. Talk to your health care provider about which screenings and vaccines you need and how often you need them. This information is not intended to replace advice given to you by your health care provider. Make sure you discuss any questions you have with your health care provider. Document Released: 12/24/2001 Document Revised: 06/10/2017 Document Reviewed: 08/29/2015 Elsevier Interactive Patient Education  2019 [employer].

## 2018-12-11 NOTE — Assessment & Plan Note (Signed)
Compliant to rosuvastatin 5 mg, continue same. Recent lipid panel unremarkable.

## 2019-01-09 ENCOUNTER — Other Ambulatory Visit: Payer: Self-pay | Admitting: Primary Care

## 2019-01-09 DIAGNOSIS — G43901 Migraine, unspecified, not intractable, with status migrainosus: Secondary | ICD-10-CM

## 2019-01-11 NOTE — Telephone Encounter (Signed)
Last prescribed on 12/11/2018 . Last office visit on 12/11/2018. No future appointment

## 2019-01-12 NOTE — Telephone Encounter (Signed)
See my chart message

## 2019-01-14 ENCOUNTER — Encounter: Payer: Self-pay | Admitting: Family Medicine

## 2019-01-14 ENCOUNTER — Telehealth: Payer: Self-pay

## 2019-01-14 ENCOUNTER — Ambulatory Visit: Payer: BC Managed Care – PPO | Admitting: Family Medicine

## 2019-01-14 VITALS — BP 134/76 | HR 94 | Temp 98.7°F | Resp 12 | Ht 68.0 in | Wt 262.5 lb

## 2019-01-14 DIAGNOSIS — J069 Acute upper respiratory infection, unspecified: Secondary | ICD-10-CM | POA: Diagnosis not present

## 2019-01-14 DIAGNOSIS — J029 Acute pharyngitis, unspecified: Secondary | ICD-10-CM

## 2019-01-14 DIAGNOSIS — R509 Fever, unspecified: Secondary | ICD-10-CM

## 2019-01-14 LAB — POCT RAPID STREP A (OFFICE): Rapid Strep A Screen: NEGATIVE

## 2019-01-14 LAB — POCT INFLUENZA A/B
Influenza A, POC: NEGATIVE
Influenza B, POC: NEGATIVE

## 2019-01-14 NOTE — Patient Instructions (Signed)
Flu Test negative Strept Test negative  Based on your symptoms, it looks like you have a virus.   Antibiotics are not need for a viral infection but the following will help:   1. Drink plenty of fluids 2. Get lots of rest  Sinus Congestion 1) Neti Pot (Saline rinse) -- 2 times day -- if tolerated 2) Flonase (Store Brand ok) - once daily 3) Over the counter congestion medications  Cough 1) Cough drops can be helpful 2) Nyquil (or nighttime cough medication) 3) Honey is proven to be one of the best cough medications   Sore Throat 1) Honey as above, cough drops 2) Ibuprofen or Aleve can be helpful 3) Salt water Gargles  If you develop fevers (Temperature >100.4), chills, worsening symptoms or symptoms lasting longer than 10 days return to clinic.

## 2019-01-14 NOTE — Telephone Encounter (Signed)
Patient have an appointment with Dr Selena Batten today at 11 am

## 2019-01-14 NOTE — Telephone Encounter (Signed)
Unable to reach pt by phone.

## 2019-01-14 NOTE — Telephone Encounter (Signed)
[facility] Night - Client Nonclinical Telephone Record [facility] Client [facility] Night - Client Client Site [facility] - Night Physician Vernona Rieger - NP Contact Type Call Who Is Calling Patient / Member / Family / Caregiver Caller Name Delana Molock Caller Phone Number [phone] Patient Name Gloria Dyer Patient DOB 06/04/1986 Call Type Message Only Information Provided Reason for Call Request to Schedule Office Appointment Initial Comment Caller states she needs to schedule an apt for tomorrow. Additional Comment Call Closed By: Eldridge Abrahams Transaction Date/Time: 01/13/2019 10:55:45 PM (ET)

## 2019-01-14 NOTE — Progress Notes (Signed)
Subjective:     Gloria Dyer is a 33 y.o. female presenting for Sore Throat (woke up with sore throat yesterday-01/13/2019, feeling worse, fever 101 last night, headache, back and shoulder pain, neck hurts, body aches. )     Sore Throat   This is a new problem. The current episode started yesterday. The problem has been gradually worsening. Neither side of throat is experiencing more pain than the other. The maximum temperature recorded prior to her arrival was 101 - 101.9 F. The pain is moderate. Associated symptoms include congestion, headaches, swollen glands and trouble swallowing. Pertinent negatives include no coughing or shortness of breath. She has had exposure to strep. Exposure to: flu. She has tried NSAIDs for the symptoms. The treatment provided mild relief.     Review of Systems  Constitutional: Positive for chills and fever.  HENT: Positive for congestion, sore throat and trouble swallowing. Negative for postnasal drip, rhinorrhea and sinus pain.   Respiratory: Negative for cough and shortness of breath.   Cardiovascular: Negative for chest pain and palpitations.  Musculoskeletal: Positive for arthralgias and myalgias.  Neurological: Positive for headaches.     Social History   Tobacco Use  Smoking Status Never Smoker  Smokeless Tobacco Never Used        Objective:    BP Readings from Last 3 Encounters:  01/14/19 134/76  12/11/18 122/72  09/01/18 122/78   Wt Readings from Last 3 Encounters:  01/14/19 262 lb 8 oz (119.1 kg)  12/11/18 270 lb 4 oz (122.6 kg)  09/01/18 279 lb 12.8 oz (126.9 kg)    BP 134/76   Pulse 94   Temp 98.7 F (37.1 C)   Resp 12   Ht 5\' 8"  (1.727 m)   Wt 262 lb 8 oz (119.1 kg)   SpO2 97%   BMI 39.91 kg/m    Physical Exam Constitutional:      General: She is not in acute distress.    Appearance: She is well-developed. She is not diaphoretic.  HENT:     Head: Normocephalic and atraumatic.     Right Ear: Tympanic  membrane and ear canal normal.     Left Ear: Tympanic membrane and ear canal normal.     Nose: Mucosal edema and rhinorrhea present.     Right Sinus: No maxillary sinus tenderness or frontal sinus tenderness.     Left Sinus: No maxillary sinus tenderness or frontal sinus tenderness.     Mouth/Throat:     Pharynx: Uvula midline. Posterior oropharyngeal erythema present. No oropharyngeal exudate.     Tonsils: Swelling: 0 on the right. 0 on the left.  Eyes:     General: No scleral icterus.    Conjunctiva/sclera: Conjunctivae normal.  Neck:     Musculoskeletal: Neck supple.  Cardiovascular:     Rate and Rhythm: Normal rate and regular rhythm.     Heart sounds: Normal heart sounds. No murmur.  Pulmonary:     Effort: Pulmonary effort is normal. No respiratory distress.     Breath sounds: Normal breath sounds.  Lymphadenopathy:     Cervical: Cervical adenopathy present.  Skin:    General: Skin is warm and dry.     Capillary Refill: Capillary refill takes less than 2 seconds.  Neurological:     Mental Status: She is alert.       Rapid flu: negative Strept: negative    Assessment & Plan:   Problem List Items Addressed This Visit    None  Visit Diagnoses    Viral URI    -  Primary   Fever, unspecified fever cause       Relevant Orders   POCT Influenza A/B   Rapid Strep A   Sore throat       Relevant Orders   Rapid Strep A     Given exposure tested for both which were negative. Likely viral illness.   Symptomatic care  Return if symptoms worsen or fail to improve.  Lynnda Child, MD

## 2019-01-20 ENCOUNTER — Other Ambulatory Visit: Payer: Self-pay | Admitting: Primary Care

## 2019-01-20 DIAGNOSIS — J329 Chronic sinusitis, unspecified: Secondary | ICD-10-CM

## 2019-01-26 ENCOUNTER — Other Ambulatory Visit: Payer: Self-pay | Admitting: *Deleted

## 2019-01-26 DIAGNOSIS — Z0000 Encounter for general adult medical examination without abnormal findings: Secondary | ICD-10-CM

## 2019-01-26 MED ORDER — NORETHINDRONE ACET-ETHINYL EST 1.5-30 MG-MCG PO TABS: 1.0000 | ORAL_TABLET | Freq: Every day | ORAL | 1 refills | Status: DC

## 2019-01-27 ENCOUNTER — Other Ambulatory Visit: Payer: Self-pay | Admitting: Family Medicine

## 2019-01-27 DIAGNOSIS — Z0000 Encounter for general adult medical examination without abnormal findings: Secondary | ICD-10-CM

## 2019-01-27 MED ORDER — NORETHINDRONE ACET-ETHINYL EST 1.5-30 MG-MCG PO TABS: 1.0000 | ORAL_TABLET | Freq: Every day | ORAL | 4 refills | Status: DC

## 2019-01-27 NOTE — Progress Notes (Signed)
Sent in OCP rx

## 2019-01-27 NOTE — Telephone Encounter (Signed)
-----   Message from Rozann Lesches, NT sent at 01/26/2019  2:21 PM EDT ----- Regarding: refill for [facility] to over lapse until AE Please send refill for [facility] to [facility] [employer] on [address]

## 2019-03-10 ENCOUNTER — Ambulatory Visit: Payer: BC Managed Care – PPO | Admitting: Family Medicine

## 2019-04-17 ENCOUNTER — Other Ambulatory Visit: Payer: Self-pay | Admitting: Primary Care

## 2019-04-17 DIAGNOSIS — K219 Gastro-esophageal reflux disease without esophagitis: Secondary | ICD-10-CM

## 2019-04-27 ENCOUNTER — Telehealth: Payer: Self-pay | Admitting: Radiology

## 2019-04-27 NOTE — Telephone Encounter (Signed)
Spoke with patient about rescheduling her Annual exam with Dr Ernestina Patches (last 01/14/18) which was cancelled for COVID 19, patient states that she is not ready to come back and wanted to know about getting a refill. I explained that we could send in refill for [facility] but would definitely need to see her in the next couple of months.

## 2019-05-24 ENCOUNTER — Telehealth (INDEPENDENT_AMBULATORY_CARE_PROVIDER_SITE_OTHER): Payer: BC Managed Care – PPO | Admitting: Primary Care

## 2019-05-24 DIAGNOSIS — F411 Generalized anxiety disorder: Secondary | ICD-10-CM

## 2019-05-24 DIAGNOSIS — G43701 Chronic migraine without aura, not intractable, with status migrainosus: Secondary | ICD-10-CM | POA: Diagnosis not present

## 2019-05-24 MED ORDER — SERTRALINE HCL 25 MG PO TABS: 25.0000 mg | ORAL_TABLET | Freq: Every day | ORAL | 1 refills | Status: DC

## 2019-05-24 NOTE — Progress Notes (Signed)
Subjective:    Patient ID: Gloria Dyer, female    DOB: 04-03-1986, 33 y.o.   MRN: [id number]  HPI  Virtual Visit via Video Note  I connected with Gloria Antunna M Million on 05/24/19 at  2:40 PM EDT by a video enabled telemedicine application and verified that I am speaking with the correct person using two identifiers.  Location: Patient: Work Provider: Office   I discussed the limitations of evaluation and management by telemedicine and the availability of in person appointments. The patient expressed understanding and agreed to proceed.  History of Present Illness:  Ms. Jannifer RodneyHartgrove is a 33 year old female with a history of migraines, recurrent sinusitis, anxiety, GERD who presents today with a chief complaint of migraines.  Her recent migraine began about two days ago to the left temporal region without photophobia and nausea. She took one of her Flexeril tablets last night with some improvement today.   Since March 2020 she's had 4-5 migraines and 3-4 headaches. She will sometimes experience photophobia and neck pain, never nausea. She is managed on gabapentin 600 mg HS for migraine prevention for which she has taken for the past 10 years with good management except for recently. She has not taken Ibuprofen recently due to worries of Covid. She's tried Excedrin Migraine in the past which causes an increase in migraine. Tylenol causes GI upset.   She has a long history of anxiety symptoms, never formally diagnosed or treated. She does know that her recent and frequent migraines are triggered by stress and anxiety. She experiences symptoms of daily worry, daily anxiety/stress, inability to rest. GAD 7 score 15 today. She is open for treatment.   Observations/Objective:  Alert and oriented. Appears well, not sickly. No distress. Speaking in complete sentences.   Assessment and Plan:  Migraines triggered by anxiety/stress. Long history of stress/anxiety that has not been treated.  Discussed different options for treatment and she opts for medication. Kindly declines therapy given work schedule.  Rx for Zoloft 25 mg sent to pharmacy. Patient is to take 1/2 tablet daily for 6 days, then advance to 1 full tablet thereafter. We discussed possible side effects of headache, GI upset, drowsiness, and SI/HI. If thoughts of SI/HI develop, we discussed to present to the emergency immediately. Patient verbalized understanding.   Follow up in 6 weeks for re-evaluation.    Follow Up Instructions:  Start sertraline (Zoloft) 25 mg tablets for anxiety. Start by taking 1/2 tablet daily for 6 days, then increase to 1 full tablet thereafter.  Increase your Flexeril to 10 mg at bedtime if needed for migraine.  Continue taking gabapentin as prescribed.  It's okay to take Ibuprofen 600 mg as needed for migraine/headache. Do not exceed 2400 mg in 24 hours.  Schedule a follow up visit with me in 6 weeks.  It was a pleasure to see you today! Mayra ReelKate Myana Schlup, NP-C    I discussed the assessment and treatment plan with the patient. The patient was provided an opportunity to ask questions and all were answered. The patient agreed with the plan and demonstrated an understanding of the instructions.   The patient was advised to call back or seek an in-person evaluation if the symptoms worsen or if the condition fails to improve as anticipated.     Doreene NestKatherine K Letisia Schwalb, NP    Review of Systems  Eyes: Positive for photophobia.  Gastrointestinal: Negative for nausea.  Neurological: Positive for headaches.       See HPI  Psychiatric/Behavioral: The patient is nervous/anxious.        See HPI       Past Medical History:  Diagnosis Date  . Endometriosis   . GERD (gastroesophageal reflux disease)   . Hyperlipidemia   . IBS (irritable bowel syndrome)   . Migraines   . 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 Marital status: Married    Spouse  name: Not on file  . Number of children: Not on file  . Years of education: Not on file  . Highest education level: Not on file  Occupational History  . Not on file  Social Needs  . Financial resource strain: Not on file  . Food insecurity    Worry: Not on file    Inability: Not on file  . Transportation needs    Medical: Not on file    Non-medical: Not on file  Tobacco Use  . Smoking status: Never Smoker  . Smokeless tobacco: Never Used  Substance and Sexual Activity  . Alcohol use: No  . Drug use: No  . Sexual activity: Never    Partners: Male    Birth control/protection: OCP  Lifestyle  . Physical activity    Days per week: Not on file    Minutes per session: Not on file  . Stress: Not on file  Relationships  . Social Herbalist on phone: Not on file    Gets together: Not on file    Attends religious service: Not on file    Active member of club or organization: Not on file    Attends meetings of clubs or organizations: Not on file    Relationship status: Not on file  . Intimate partner violence    Fear of current or ex partner: Not on file    Emotionally abused: Not on file    Physically abused: Not on file    Forced sexual activity: Not on file  Other Topics Concern  . Not on file  Social History Narrative   Single.   No children.   Works as a Pharmacist, hospital.   Currently in graduate school.    Past Surgical History:  Procedure Laterality Date  . ENDOMETRIAL BIOPSY    . TONSILLECTOMY AND ADENOIDECTOMY      Family History  Problem Relation Age of Onset  . Breast cancer Mother 66       stage 3C  . Asthma Mother   . Thyroid disease Mother   . Graves' disease Mother   . Hyperlipidemia Father   . Stroke Father   . Hypertension Father   . Macular degeneration Father   . Diabetes Paternal Grandmother   . Hyperlipidemia Paternal Grandmother   . Glaucoma Paternal Grandmother   . Alzheimer's disease Paternal Grandmother   . Diabetes Paternal  Grandfather   . Hyperlipidemia Paternal Grandfather   . Heart disease Paternal Grandfather   . Hypertension Paternal Grandfather   . Kidney disease Paternal Grandfather   . Macular degeneration Paternal Grandfather   . Eating disorder Sister   . Alzheimer's disease Maternal Grandfather   . Breast cancer Maternal Aunt   . Diabetes Paternal Aunt     No Known Allergies  Current Outpatient Medications on File Prior to Visit  Medication Sig Dispense Refill  . cyclobenzaprine (FLEXERIL) 5 MG tablet Take 1 tablet (5 mg total) by mouth at bedtime. 30 tablet 0  . dicyclomine (BENTYL)  10 MG capsule Take 1 capsule (10 mg total) by mouth 4 (four) times daily -  before meals and at bedtime. 20 capsule 0  . fluticasone (FLONASE) 50 MCG/ACT nasal spray PLACE 1 SPRAY INTO BOTH NOSTRILS 2 (TWO) TIMES DAILY. 16 g 0  . gabapentin (NEURONTIN) 600 MG tablet Take 1 tablet (600 mg total) by mouth at bedtime. For headache/migraine prevention. 90 tablet 3  . lidocaine (XYLOCAINE) 2 % solution Gargle and swallow 15 ml every 4-6 hours as needed for throat pain. 180 mL 0  . Melatonin 3 MG TABS Take 3 mg by mouth.    . montelukast (SINGULAIR) 10 MG tablet TAKE 1 TABLET BY MOUTH EVERYDAY AT BEDTIME 90 tablet 1  . Norethindrone Acetate-Ethinyl Estradiol (JUNEL 1.5/30) 1.5-30 MG-MCG tablet Take 1 tablet by mouth daily. 3 Package 4  . pantoprazole (PROTONIX) 20 MG tablet TAKE 1 TABLET BY MOUTH EVERY DAY 90 tablet 1  . Probiotic Product (ALIGN PO) Take 1 tablet by mouth.    . rosuvastatin (CRESTOR) 5 MG tablet Take 1 tablet (5 mg total) by mouth daily. For cholesterol. 90 tablet 3   No current facility-administered medications on file prior to visit.     There were no vitals taken for this visit.   Objective:   Physical Exam  Constitutional: She is oriented to person, place, and time. She appears well-nourished.  Respiratory: Effort normal.  Neurological: She is alert and oriented to person, place, and time.   Psychiatric: She has a normal mood and affect.           Assessment & Plan:

## 2019-05-24 NOTE — Assessment & Plan Note (Signed)
Migraines triggered by anxiety/stress. Long history of stress/anxiety that has not been treated. Discussed different options for treatment and she opts for medication. Kindly declines therapy given work schedule.  Rx for Zoloft 25 mg sent to pharmacy. Patient is to take 1/2 tablet daily for 6 days, then advance to 1 full tablet thereafter. We discussed possible side effects of headache, GI upset, drowsiness, and SI/HI. If thoughts of SI/HI develop, we discussed to present to the emergency immediately. Patient verbalized understanding.   Follow up in 6 weeks for re-evaluation.

## 2019-05-24 NOTE — Assessment & Plan Note (Signed)
Migraines triggered by anxiety/stress. Long history of stress/anxiety that has not been treated. Discussed different options for treatment and she opts for medication. Kindly declines therapy given work schedule.  Continue gabapentin 600 mg HS. Discussed to use Ibuprofen if needed. Increase Flexeril to 10 mg HS if needed.  Treat anxiety which seems to be trigger for migraines. We will follow up in 6 weeks.

## 2019-05-24 NOTE — Patient Instructions (Signed)
Start sertraline (Zoloft) 25 mg tablets for anxiety. Start by taking 1/2 tablet daily for 6 days, then increase to 1 full tablet thereafter.  Increase your Flexeril to 10 mg at bedtime if needed for migraine.  Continue taking gabapentin as prescribed.  It's okay to take Ibuprofen 600 mg as needed for migraine/headache. Do not exceed 2400 mg in 24 hours.  Schedule a follow up visit with me in 6 weeks.  It was a pleasure to see you today! Allie Bossier, NP-C

## 2019-05-28 NOTE — Telephone Encounter (Signed)
Gloria Dyer, See message below.  Thanks!

## 2019-06-16 ENCOUNTER — Other Ambulatory Visit: Payer: Self-pay | Admitting: Primary Care

## 2019-06-16 DIAGNOSIS — F411 Generalized anxiety disorder: Secondary | ICD-10-CM

## 2019-07-14 ENCOUNTER — Other Ambulatory Visit: Payer: Self-pay | Admitting: Primary Care

## 2019-07-14 DIAGNOSIS — J329 Chronic sinusitis, unspecified: Secondary | ICD-10-CM

## 2019-09-03 DIAGNOSIS — F411 Generalized anxiety disorder: Secondary | ICD-10-CM

## 2019-09-03 MED ORDER — SERTRALINE HCL 25 MG PO TABS: 25.0000 mg | ORAL_TABLET | Freq: Every day | ORAL | 0 refills | Status: DC

## 2019-10-13 ENCOUNTER — Telehealth: Payer: BC Managed Care – PPO | Admitting: Family Medicine

## 2019-10-21 ENCOUNTER — Telehealth: Payer: Self-pay

## 2019-10-21 NOTE — Telephone Encounter (Signed)
[place] Night - Client Nonclinical Telephone Record AccessNurse Client [place] Night - Client Client Site [place] Physician Alma Friendly - NP Contact Type Call Who Is Calling Patient / Member / Family / Caregiver Caller Name [place] Phone Number [phone] Patient Name Kajal Scalici Patient DOB Apr 08, 1986 Call Type Message Only Information Provided Reason for Call Request to Schedule Office Appointment Initial Comment Caller states she needs to schedule a follow up video appointment Additional Comment Please call. Disp. Time Disposition Final User 10/21/2019 5:49:24 AM General Information Provided Yes Clydene Laming, Amy Call Closed By: Garvin Lions Transaction Date/Time: 10/21/2019 5:47:42 AM (ET)

## 2019-10-21 NOTE — Telephone Encounter (Signed)
Lvm asking pt to call office 

## 2019-10-27 ENCOUNTER — Ambulatory Visit: Payer: BC Managed Care – PPO | Admitting: Family Medicine

## 2019-10-29 ENCOUNTER — Telehealth (INDEPENDENT_AMBULATORY_CARE_PROVIDER_SITE_OTHER): Payer: BC Managed Care – PPO | Admitting: Primary Care

## 2019-10-29 ENCOUNTER — Other Ambulatory Visit: Payer: Self-pay

## 2019-10-29 ENCOUNTER — Encounter: Payer: Self-pay | Admitting: Primary Care

## 2019-10-29 DIAGNOSIS — G43701 Chronic migraine without aura, not intractable, with status migrainosus: Secondary | ICD-10-CM | POA: Diagnosis not present

## 2019-10-29 DIAGNOSIS — F411 Generalized anxiety disorder: Secondary | ICD-10-CM

## 2019-10-29 MED ORDER — SERTRALINE HCL 25 MG PO TABS: 25.0000 mg | ORAL_TABLET | Freq: Every day | ORAL | 1 refills | Status: DC

## 2019-10-29 NOTE — Progress Notes (Signed)
Subjective:    Patient ID: Gloria Dyer, female    DOB: [birth year], 33 y.o.   MRN: [id number]  HPI  Virtual Visit via Video Note  I connected with Gloria Dyer on 10/29/19 at  7:40 AM EST by a video enabled telemedicine application and verified that I am speaking with the correct person using two identifiers.  Location: Patient: [employer] Provider: Office   I discussed the limitations of evaluation and management by telemedicine and the availability of in person appointments. The patient expressed understanding and agreed to proceed.  History of Present Illness:  Gloria Dyer is a 33 year old female with a history of migraines, anxiety disorder who presents today for follow up.  1) Migraines: Currently managed on Gabapentin 600 mg HS and is doing well. Less migraines since initiation of Zoloft 25 mg, feels that anxiety was contributing.  2) GAD: Currently managed on Zoloft 25 mg daily which was initiated in July 2020 for symptoms of untreated anxiety/stress that increased since Covid-19.   Since her last visit she's doing better, no recent migraines. Positive effects include easily able to handle stress, less stomach upset, less overall anxiety, less on edge. She would like to continue and is needing refills soon.  She denies GI upset, nausea, SI/HI.   Observations/Objective:  Alert and oriented. Appears well, not sickly. No distress. Speaking in complete sentences.   Assessment and Plan:  See problem based charting.  Follow Up Instructions:  Continue sertraline (Zoloft) for anxiety.  Please update me if your migraines return/anxiety increases.  It was a pleasure to see you today! Mayra ReelKate Sherolyn Trettin, NP-C    I discussed the assessment and treatment plan with the patient. The patient was provided an opportunity to ask questions and all were answered. The patient agreed with the plan and demonstrated an understanding of the instructions.   The patient was advised to  call back or seek an in-person evaluation if the symptoms worsen or if the condition fails to improve as anticipated.     Doreene NestKatherine K Danaly Bari, NP    Review of Systems  Gastrointestinal: Negative for abdominal pain and nausea.  Neurological: Negative for headaches.  Psychiatric/Behavioral:       See HPI       Past Medical History:  Diagnosis Date  . Endometriosis   . GERD (gastroesophageal reflux disease)   . Hyperlipidemia   . IBS (irritable bowel syndrome)   . Migraines   . 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 Marital status: Married    Spouse name: Not on file  . Number of children: Not on file  . Years of education: Not on file  . Highest education level: Not on file  Occupational History  . Not on file  Tobacco Use  . Smoking status: Never Smoker  . Smokeless tobacco: Never Used  Substance and Sexual Activity  . Alcohol use: No  . Drug use: No  . Sexual activity: Never    Partners: Male    Birth control/protection: OCP  Other Topics Concern  . Not on file  Social History Narrative   Single.   No children.   Works as a Runner, broadcasting/film/videoteacher.   Currently in graduate school.   Social Determinants of Health   Financial Resource Strain:   . Difficulty of Paying Living Expenses: Not on file  Food Insecurity:   . Worried About Programme researcher, broadcasting/film/videounning Out of Food in the  Last Year: Not on file  . Ran Out of Food in the Last Year: Not on file  Transportation Needs:   . Lack of Transportation (Medical): Not on file  . Lack of Transportation (Non-Medical): Not on file  Physical Activity:   . Days of Exercise per Week: Not on file  . Minutes of Exercise per Session: Not on file  Stress:   . Feeling of Stress : Not on file  Social Connections:   . Frequency of Communication with Friends and Family: Not on file  . Frequency of Social Gatherings with Friends and Family: Not on file  . Attends Religious Services: Not on file  . Active Member of  Clubs or Organizations: Not on file  . Attends Archivist Meetings: Not on file  . Marital Status: Not on file  Intimate Partner Violence:   . Fear of Current or Ex-Partner: Not on file  . Emotionally Abused: Not on file  . Physically Abused: Not on file  . Sexually Abused: Not on file    Past Surgical History:  Procedure Laterality Date  . ENDOMETRIAL BIOPSY    . TONSILLECTOMY AND ADENOIDECTOMY      Family History  Problem Relation Age of Onset  . Breast cancer Mother 72       stage 3C  . Asthma Mother   . Thyroid disease Mother   . Graves' disease Mother   . Hyperlipidemia Father   . Stroke Father   . Hypertension Father   . Macular degeneration Father   . Diabetes Paternal Grandmother   . Hyperlipidemia Paternal Grandmother   . Glaucoma Paternal Grandmother   . Alzheimer's disease Paternal Grandmother   . Diabetes Paternal Grandfather   . Hyperlipidemia Paternal Grandfather   . Heart disease Paternal Grandfather   . Hypertension Paternal Grandfather   . Kidney disease Paternal Grandfather   . Macular degeneration Paternal Grandfather   . Eating disorder Sister   . Alzheimer's disease Maternal Grandfather   . Breast cancer Maternal Aunt   . Diabetes Paternal Aunt     No Known Allergies  Current Outpatient Medications on File Prior to Visit  Medication Sig Dispense Refill  . cyclobenzaprine (FLEXERIL) 5 MG tablet Take 1 tablet (5 mg total) by mouth at bedtime. 30 tablet 0  . dicyclomine (BENTYL) 10 MG capsule Take 1 capsule (10 mg total) by mouth 4 (four) times daily -  before meals and at bedtime. 20 capsule 0  . fluticasone (FLONASE) 50 MCG/ACT nasal spray PLACE 1 SPRAY INTO BOTH NOSTRILS 2 (TWO) TIMES DAILY. 16 g 0  . gabapentin (NEURONTIN) 600 MG tablet Take 1 tablet (600 mg total) by mouth at bedtime. For headache/migraine prevention. 90 tablet 3  . lidocaine (XYLOCAINE) 2 % solution Gargle and swallow 15 ml every 4-6 hours as needed for throat pain.  180 mL 0  . Melatonin 3 MG TABS Take 3 mg by mouth.    . montelukast (SINGULAIR) 10 MG tablet TAKE 1 TABLET BY MOUTH EVERYDAY AT BEDTIME 90 tablet 1  . Norethindrone Acetate-Ethinyl Estradiol (JUNEL 1.5/30) 1.5-30 MG-MCG tablet Take 1 tablet by mouth daily. 3 Package 4  . pantoprazole (PROTONIX) 20 MG tablet TAKE 1 TABLET BY MOUTH EVERY DAY 90 tablet 1  . Probiotic Product (ALIGN PO) Take 1 tablet by mouth.    . rosuvastatin (CRESTOR) 5 MG tablet Take 1 tablet (5 mg total) by mouth daily. For cholesterol. 90 tablet 3   No current facility-administered medications on file  prior to visit.    There were no vitals taken for this visit.   Objective:   Physical Exam  Constitutional: She is oriented to person, place, and time. She appears well-nourished.  Respiratory: Effort normal.  Neurological: She is alert and oriented to person, place, and time.  Psychiatric: She has a normal mood and affect.  Appears much better than last visit           Assessment & Plan:

## 2019-10-29 NOTE — Assessment & Plan Note (Signed)
Improved since initiation of Zoloft 25 mg, at goal overall. Denies SI/HI. Continue same, refills sent to pharmacy.

## 2019-10-29 NOTE — Patient Instructions (Signed)
Continue sertraline (Zoloft) for anxiety.  Please update me if your migraines return/anxiety increases.  It was a pleasure to see you today! Allie Bossier, NP-C

## 2019-10-29 NOTE — Assessment & Plan Note (Signed)
Significant reduction since anxiety is now under better control. Continue gabapentin HS. Continue anxiety control.

## 2019-10-30 ENCOUNTER — Other Ambulatory Visit: Payer: Self-pay | Admitting: Primary Care

## 2019-10-30 DIAGNOSIS — K219 Gastro-esophageal reflux disease without esophagitis: Secondary | ICD-10-CM

## 2019-11-10 ENCOUNTER — Ambulatory Visit: Payer: BC Managed Care – PPO | Admitting: Family Medicine

## 2019-11-17 ENCOUNTER — Ambulatory Visit: Payer: BC Managed Care – PPO | Attending: Internal Medicine

## 2019-11-17 DIAGNOSIS — Z20822 Contact with and (suspected) exposure to covid-19: Secondary | ICD-10-CM

## 2019-11-19 LAB — NOVEL CORONAVIRUS, NAA: SARS-CoV-2, NAA: NOT DETECTED

## 2019-12-08 ENCOUNTER — Ambulatory Visit: Payer: BC Managed Care – PPO | Admitting: Family Medicine

## 2019-12-24 DIAGNOSIS — E785 Hyperlipidemia, unspecified: Secondary | ICD-10-CM

## 2019-12-24 DIAGNOSIS — J329 Chronic sinusitis, unspecified: Secondary | ICD-10-CM

## 2019-12-24 MED ORDER — MONTELUKAST SODIUM 10 MG PO TABS: ORAL_TABLET | ORAL | 2 refills | Status: DC

## 2019-12-24 MED ORDER — ROSUVASTATIN CALCIUM 5 MG PO TABS: 5.0000 mg | ORAL_TABLET | Freq: Every day | ORAL | 2 refills | Status: DC

## 2020-01-05 ENCOUNTER — Other Ambulatory Visit: Payer: Self-pay | Admitting: Family Medicine

## 2020-01-05 ENCOUNTER — Other Ambulatory Visit: Payer: Self-pay

## 2020-01-05 ENCOUNTER — Ambulatory Visit (INDEPENDENT_AMBULATORY_CARE_PROVIDER_SITE_OTHER): Payer: BC Managed Care – PPO | Admitting: Family Medicine

## 2020-01-05 ENCOUNTER — Encounter: Payer: Self-pay | Admitting: Family Medicine

## 2020-01-05 VITALS — BP 130/82 | HR 78 | Wt 283.2 lb

## 2020-01-05 DIAGNOSIS — Z0000 Encounter for general adult medical examination without abnormal findings: Secondary | ICD-10-CM

## 2020-01-05 DIAGNOSIS — Z124 Encounter for screening for malignant neoplasm of cervix: Secondary | ICD-10-CM | POA: Diagnosis not present

## 2020-01-05 DIAGNOSIS — Z803 Family history of malignant neoplasm of breast: Secondary | ICD-10-CM

## 2020-01-05 DIAGNOSIS — Z01419 Encounter for gynecological examination (general) (routine) without abnormal findings: Secondary | ICD-10-CM

## 2020-01-05 MED ORDER — NORETHINDRONE ACET-ETHINYL EST 1.5-30 MG-MCG PO TABS: 1.0000 | ORAL_TABLET | Freq: Every day | ORAL | 4 refills | Status: DC

## 2020-01-05 NOTE — Progress Notes (Signed)
No concerns doing ok. Last pap 12/26/2016- normal

## 2020-01-05 NOTE — Progress Notes (Signed)
   GYNECOLOGY ANNUAL PREVENTATIVE CARE ENCOUNTER NOTE  Subjective:   Gloria Dyer is a 34 y.o. G0P0000 female here for a routine annual gynecologic exam.  Current complaints: none.   Denies abnormal vaginal bleeding, discharge, pelvic pain, problems with intercourse or other gynecologic concerns.    Gynecologic History No LMP recorded. (Menstrual status: Oral contraceptives). Contraception: OCP (estrogen/progesterone)- not sexually active Last Pap: 2018. Results were: normal Last mammogram: at 43- mother with BC at 59. Patient counseled on starting at 34yo. Results were: normal  The following portions of the patient's history were reviewed and updated as appropriate: allergies, current medications, past family history, past medical history, past social history, past surgical history and problem list.  Review of Systems Pertinent items are noted in HPI.   Objective:  BP 130/82   Pulse 78   Wt 283 lb 3.2 oz (128.5 kg)   BMI 43.06 kg/m  CONSTITUTIONAL: Well-developed, well-nourished female in no acute distress.  HENT:  Normocephalic, atraumatic, External right and left ear normal. Oropharynx is clear and moist EYES:  No scleral icterus.  NECK: Normal range of motion, supple, no masses.  Normal thyroid.  SKIN: Skin is warm and dry. No rash noted. Not diaphoretic. No erythema. No pallor. NEUROLOGIC: Alert and oriented to person, place, and time. Normal reflexes, muscle tone coordination. No cranial nerve deficit noted. PSYCHIATRIC: Normal mood and affect. Normal behavior. Normal judgment and thought content. CARDIOVASCULAR: Normal heart rate noted, regular rhythm. 2+ distal pulses. RESPIRATORY: Effort and breath sounds normal, no problems with respiration noted. BREASTS: Symmetric in size. No masses, skin changes, nipple drainage, or lymphadenopathy. ABDOMEN: Soft,  no distention noted.  No tenderness, rebound or guarding.  PELVIC: Normal appearing external genitalia; normal  appearing vaginal mucosa and cervix.  No abnormal discharge noted.  Pap smear obtained.  Normal uterine size, no other palpable masses, no uterine or adnexal tenderness. MUSCULOSKELETAL: Normal range of motion.     Assessment and Plan:  1) Annual gynecologic examination with pap smear:  Will follow up results of pap smear and manage accordingly. Declines STI screen  today.  Routine preventative health maintenance measures emphasized.  2) Contraception counseling: Reviewed all forms of birth control options available including abstinence; over the counter/barrier methods; hormonal contraceptive medication including pill, patch, ring, injection,contraceptive implant; hormonal and nonhormonal IUDs; permanent sterilization options including vasectomy and the various tubal sterilization modalities. Risks and benefits reviewed.  Questions were answered.  Written information was also given to the patient to review.  Patient desires OCP, this was prescribed for patient. She will follow up in  1 year for surveillance.  She was told to call with any further questions, or with any concerns about this method of contraception.  Emphasized use of condoms 100% of the time for STI prevention.  Use Junel for cycle control  1. Well woman exam with routine gynecological exam - CBC - Hemoglobin A1c - TSH - Lipid panel - Cytology - PAP( [facility])  2. Well woman exam (no gynecological exam) - Norethindrone Acetate-Ethinyl Estradiol (JUNEL 1.5/30) 1.5-30 MG-MCG tablet; Take 1 tablet by mouth daily.  Dispense: 3 Package; Refill: 4  3. Family history of breast cancer Mother@50  counseled on starting screening at 34 yo.   Please refer to After Visit Summary for other counseling recommendations.   Return in about 1 year (around 01/04/2021) for Yearly wellness exam.  Federico Flake, MD, MPH, ABFM Attending Physician Center [facility]

## 2020-01-06 LAB — HEMOGLOBIN A1C
Est. average glucose Bld gHb Est-mCnc: 103 mg/dL
Hgb A1c MFr Bld: 5.2 % (ref 4.8–5.6)

## 2020-01-06 LAB — LIPID PANEL
Chol/HDL Ratio: 3.7 ratio (ref 0.0–4.4)
Cholesterol, Total: 163 mg/dL (ref 100–199)
HDL: 44 mg/dL (ref >39)
LDL Chol Calc (NIH): 105 mg/dL — ABNORMAL HIGH (ref 0–99)
Triglycerides: 72 mg/dL (ref 0–149)
VLDL Cholesterol Cal: 14 mg/dL (ref 5–40)

## 2020-01-06 LAB — CBC
Hematocrit: 41.9 % (ref 34.0–46.6)
Hemoglobin: 13.8 g/dL (ref 11.1–15.9)
MCH: 27.1 pg (ref 26.6–33.0)
MCHC: 32.9 g/dL (ref 31.5–35.7)
MCV: 82 fL (ref 79–97)
Platelets: 376 10*3/uL (ref 150–450)
RBC: 5.09 x10E6/uL (ref 3.77–5.28)
RDW: 12.7 % (ref 11.7–15.4)
WBC: 10.6 10*3/uL (ref 3.4–10.8)

## 2020-01-06 LAB — TSH: TSH: 0.517 u[IU]/mL (ref 0.450–4.500)

## 2020-01-11 LAB — CYTOLOGY - PAP
Comment: NEGATIVE
Diagnosis: NEGATIVE
High risk HPV: NEGATIVE

## 2020-01-12 DIAGNOSIS — Z23 Encounter for immunization: Secondary | ICD-10-CM | POA: Diagnosis not present

## 2020-02-06 DIAGNOSIS — G43901 Migraine, unspecified, not intractable, with status migrainosus: Secondary | ICD-10-CM

## 2020-02-07 MED ORDER — CYCLOBENZAPRINE HCL 5 MG PO TABS: 5.0000 mg | ORAL_TABLET | Freq: Every day | ORAL | 0 refills | Status: DC

## 2020-02-07 NOTE — Telephone Encounter (Signed)
Last prescribed on 12/11/2018 . Last appointment on 10/29/2019 (virtual follow up). No future appointment

## 2020-02-10 NOTE — Telephone Encounter (Signed)
Pt had video message on 10/29/19.

## 2020-03-02 ENCOUNTER — Other Ambulatory Visit: Payer: Self-pay | Admitting: Primary Care

## 2020-03-02 DIAGNOSIS — G43901 Migraine, unspecified, not intractable, with status migrainosus: Secondary | ICD-10-CM

## 2020-03-11 ENCOUNTER — Other Ambulatory Visit: Payer: Self-pay | Admitting: Primary Care

## 2020-03-11 DIAGNOSIS — G43901 Migraine, unspecified, not intractable, with status migrainosus: Secondary | ICD-10-CM

## 2020-03-13 NOTE — Telephone Encounter (Signed)
Last prescribed on 02/07/2020. Last OV (video visit) on 10/28/2020. No future OV

## 2020-03-13 NOTE — Telephone Encounter (Signed)
We just refilled this 6 weeks ago, how often is she having migraines now? This seems like she's taking too often.

## 2020-03-14 NOTE — Telephone Encounter (Signed)
Message left for patient to return my call.  Also send a message on MyChart

## 2020-03-20 ENCOUNTER — Telehealth: Payer: Self-pay

## 2020-03-20 ENCOUNTER — Other Ambulatory Visit: Payer: Self-pay | Admitting: Family Medicine

## 2020-03-20 NOTE — Progress Notes (Signed)
Pt with recent blood work done with GYN. Will defer labs to day of appointment.

## 2020-03-20 NOTE — Telephone Encounter (Signed)
LVM that lab appt for Tuesday 5.11.2021 is not needed prior to her physical.  Canceling lab appt

## 2020-03-21 ENCOUNTER — Other Ambulatory Visit: Payer: BC Managed Care – PPO

## 2020-03-23 ENCOUNTER — Ambulatory Visit (INDEPENDENT_AMBULATORY_CARE_PROVIDER_SITE_OTHER): Payer: BC Managed Care – PPO | Admitting: Primary Care

## 2020-03-23 ENCOUNTER — Encounter: Payer: Self-pay | Admitting: Primary Care

## 2020-03-23 ENCOUNTER — Other Ambulatory Visit: Payer: Self-pay

## 2020-03-23 VITALS — BP 122/80 | HR 85 | Temp 96.2°F | Ht 68.0 in | Wt 292.5 lb

## 2020-03-23 DIAGNOSIS — F411 Generalized anxiety disorder: Secondary | ICD-10-CM

## 2020-03-23 DIAGNOSIS — G43701 Chronic migraine without aura, not intractable, with status migrainosus: Secondary | ICD-10-CM | POA: Diagnosis not present

## 2020-03-23 DIAGNOSIS — Z0000 Encounter for general adult medical examination without abnormal findings: Secondary | ICD-10-CM | POA: Diagnosis not present

## 2020-03-23 DIAGNOSIS — Z803 Family history of malignant neoplasm of breast: Secondary | ICD-10-CM

## 2020-03-23 DIAGNOSIS — J329 Chronic sinusitis, unspecified: Secondary | ICD-10-CM

## 2020-03-23 DIAGNOSIS — R202 Paresthesia of skin: Secondary | ICD-10-CM

## 2020-03-23 DIAGNOSIS — E785 Hyperlipidemia, unspecified: Secondary | ICD-10-CM

## 2020-03-23 DIAGNOSIS — J3089 Other allergic rhinitis: Secondary | ICD-10-CM | POA: Insufficient documentation

## 2020-03-23 DIAGNOSIS — M25473 Effusion, unspecified ankle: Secondary | ICD-10-CM | POA: Insufficient documentation

## 2020-03-23 DIAGNOSIS — K219 Gastro-esophageal reflux disease without esophagitis: Secondary | ICD-10-CM | POA: Diagnosis not present

## 2020-03-23 NOTE — Progress Notes (Signed)
Subjective:    Patient ID: Gloria Dyer, female    DOB: 09-Feb-1986, 34 y.o.   MRN: [id number]  HPI  This visit occurred during the SARS-CoV-2 public health emergency.  Safety protocols were in place, including screening questions prior to the visit, additional usage of staff PPE, and extensive cleaning of exam room while observing appropriate contact time as indicated for disinfecting solutions.   Gloria Dyer is a 34 year old female who presents today for complete physical.  She would also like to mention left lower extremity swelling that occurs to the ankle and foot. Also with bilateral plantar foot tingling. Symptoms began one week ago with swelling and tingling. She notices her symptoms of tingling and swelling with rest and ambulation, swelling dissipates completely by morning.   Immunizations: -Tetanus: Completed in 2012 -Influenza: Completed last season -Covid-19: Completed series  Diet: She endorses a healthy diet. Exercise: She is not exercising.  Eye exam: Due, she will schedule Dental exam: Due, she will schedule  Pap Smear: Completed in 2021  BP Readings from Last 3 Encounters:  03/23/20 122/80  01/05/20 130/82  01/14/19 134/76     Review of Systems  Constitutional: Negative for unexpected weight change.  HENT: Negative for rhinorrhea.   Respiratory: Negative for cough and shortness of breath.   Cardiovascular: Positive for leg swelling. Negative for chest pain.  Gastrointestinal: Negative for constipation and diarrhea.  Genitourinary: Negative for difficulty urinating and menstrual problem.  Musculoskeletal: Negative for arthralgias and myalgias.  Skin: Negative for rash.  Allergic/Immunologic: Positive for environmental allergies.  Neurological: Negative for dizziness, numbness and headaches.  Psychiatric/Behavioral: The patient is not nervous/anxious.        Past Medical History:  Diagnosis Date  . Endometriosis   . GERD (gastroesophageal  reflux disease)   . Hyperlipidemia   . IBS (irritable bowel syndrome)   . Migraines   . 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 Marital status: Married    Spouse name: Not on file  . Number of children: Not on file  . Years of education: Not on file  . Highest education level: Not on file  Occupational History  . Not on file  Tobacco Use  . Smoking status: Never Smoker  . Smokeless tobacco: Never Used  Substance and Sexual Activity  . Alcohol use: No  . Drug use: No  . Sexual activity: Never    Partners: Male    Birth control/protection: OCP  Other Topics Concern  . Not on file  Social History Narrative   Single.   No children.   Works as a Pharmacist, hospital.   Currently in graduate school.   Social Determinants of Health   Financial Resource Strain:   . Difficulty of Paying Living Expenses:   Food Insecurity:   . Worried About Charity fundraiser in the Last Year:   . Arboriculturist in the Last Year:   Transportation Needs:   . Film/video editor (Medical):   Marland Kitchen Lack of Transportation (Non-Medical):   Physical Activity:   . Days of Exercise per Week:   . Minutes of Exercise per Session:   Stress:   . Feeling of Stress :   Social Connections:   . Frequency of Communication with Friends and Family:   . Frequency of Social Gatherings with Friends and Family:   . Attends Religious Services:   . Active Member of Clubs  or Organizations:   . Attends Banker Meetings:   Marland Kitchen Marital Status:   Intimate Partner Violence:   . Fear of Current or Ex-Partner:   . Emotionally Abused:   Marland Kitchen Physically Abused:   . Sexually Abused:     Past Surgical History:  Procedure Laterality Date  . ENDOMETRIAL BIOPSY    . TONSILLECTOMY AND ADENOIDECTOMY      Family History  Problem Relation Age of Onset  . Breast cancer Mother 1       stage 3C  . Asthma Mother   . Thyroid disease Mother   . Graves' disease Mother   .  Hyperlipidemia Father   . Stroke Father   . Hypertension Father   . Macular degeneration Father   . Diabetes Paternal Grandmother   . Hyperlipidemia Paternal Grandmother   . Glaucoma Paternal Grandmother   . Alzheimer's disease Paternal Grandmother   . Diabetes Paternal Grandfather   . Hyperlipidemia Paternal Grandfather   . Heart disease Paternal Grandfather   . Hypertension Paternal Grandfather   . Kidney disease Paternal Grandfather   . Macular degeneration Paternal Grandfather   . Eating disorder Sister   . Alzheimer's disease Maternal Grandfather   . Breast cancer Maternal Aunt   . Diabetes Paternal Aunt     No Known Allergies  Current Outpatient Medications on File Prior to Visit  Medication Sig Dispense Refill  . cyclobenzaprine (FLEXERIL) 5 MG tablet TAKE 1 TABLET BY MOUTH EVERYDAY AT BEDTIME 30 tablet 0  . fluticasone (FLONASE) 50 MCG/ACT nasal spray PLACE 1 SPRAY INTO BOTH NOSTRILS 2 (TWO) TIMES DAILY. 16 g 0  . gabapentin (NEURONTIN) 600 MG tablet TAKE 1 TABLET BY MOUTH AT BEDTIME FOR HEADACHE/MIGRAINE PREVENTION 90 tablet 0  . lidocaine (XYLOCAINE) 2 % solution Gargle and swallow 15 ml every 4-6 hours as needed for throat pain. 180 mL 0  . Melatonin 3 MG TABS Take 3 mg by mouth.    . montelukast (SINGULAIR) 10 MG tablet TAKE 1 TABLET BY MOUTH EVERYDAY AT BEDTIME 90 tablet 2  . Norethindrone Acetate-Ethinyl Estradiol (JUNEL 1.5/30) 1.5-30 MG-MCG tablet Take 1 tablet by mouth daily. 3 Package 4  . pantoprazole (PROTONIX) 20 MG tablet TAKE 1 TABLET BY MOUTH EVERY DAY 90 tablet 1  . Probiotic Product (ALIGN PO) Take 1 tablet by mouth.    . rosuvastatin (CRESTOR) 5 MG tablet Take 1 tablet (5 mg total) by mouth daily. For cholesterol. 90 tablet 2  . sertraline (ZOLOFT) 25 MG tablet Take 1 tablet (25 mg total) by mouth daily. For anxiety. 90 tablet 1   No current facility-administered medications on file prior to visit.    BP 122/80   Pulse 85   Temp (!) 96.2 F (35.7  C) (Temporal)   Ht 5\' 8"  (1.727 m)   Wt 292 lb 8 oz (132.7 kg)   SpO2 98%   BMI 44.47 kg/m    Objective:   Physical Exam  Constitutional: She is oriented to person, place, and time. She appears well-nourished.  HENT:  Right Ear: Tympanic membrane and ear canal normal.  Left Ear: Tympanic membrane and ear canal normal.  Mouth/Throat: Oropharynx is clear and moist.  Eyes: Pupils are equal, round, and reactive to light. EOM are normal.  Cardiovascular: Normal rate and regular rhythm.  Mild left ankle edema, no pitting.  Respiratory: Effort normal and breath sounds normal.  GI: Soft. Bowel sounds are normal. There is no abdominal tenderness.  Musculoskeletal:  General: Normal range of motion.     Cervical back: Neck supple.  Neurological: She is alert and oriented to person, place, and time. No cranial nerve deficit.  Reflex Scores:      Patellar reflexes are 2+ on the right side and 2+ on the left side. Skin: Skin is warm and dry.  Psychiatric: She has a normal mood and affect.           Assessment & Plan:

## 2020-03-23 NOTE — Assessment & Plan Note (Signed)
Left sided, mild during exam today.  Suspect this is secondary to prolonged sitting at work coupled with obesity.  Encouraged weight loss, elevation of extremity when resting, getting up from desk often. She will update.

## 2020-03-23 NOTE — Assessment & Plan Note (Signed)
Screening to start at age 34. Prior mammogram without evidence of cancer.

## 2020-03-23 NOTE — Assessment & Plan Note (Signed)
Doing well on Zoloft 25 mg, continue same.

## 2020-03-23 NOTE — Assessment & Plan Note (Signed)
Managed on pantoprazole 20 mg for which she's taken for quite some time. Discussed to try a slow wean, using Tums as needed for breakthrough symptoms. She will update.

## 2020-03-23 NOTE — Assessment & Plan Note (Signed)
Increased frequency with work stress and skipping meals. Using cyclobenzaprine sparingly as needed. Continue gabapentin daily for prevention.

## 2020-03-23 NOTE — Assessment & Plan Note (Signed)
Doing well on Singulair, continue same.  

## 2020-03-23 NOTE — Patient Instructions (Signed)
Elevate your leg when sitting. Be sure to get up and walk every hour to prevent swelling.  Please update me if the swelling and your tingling doesn't improve.  Start exercising. You should be getting 150 minutes of moderate intensity exercise weekly.  It's important to improve your diet by reducing consumption of fast food, fried food, processed snack foods, sugary drinks. Increase consumption of fresh vegetables and fruits, whole grains, water.  Ensure you are drinking 64 ounces of water daily.  It was a pleasure to see you today!   Preventive Care 34-66 Years Old, Female Preventive care refers to visits with your health care provider and lifestyle choices that can promote health and wellness. This includes:  A yearly physical exam. This may also be called an annual well check.  Regular dental visits and eye exams.  Immunizations.  Screening for certain conditions.  Healthy lifestyle choices, such as eating a healthy diet, getting regular exercise, not using drugs or products that contain nicotine and tobacco, and limiting alcohol use. What can I expect for my preventive care visit? Physical exam Your health care provider will check your:  Height and weight. This may be used to calculate body mass index (BMI), which tells if you are at a healthy weight.  Heart rate and blood pressure.  Skin for abnormal spots. Counseling Your health care provider may ask you questions about your:  Alcohol, tobacco, and drug use.  Emotional well-being.  Home and relationship well-being.  Sexual activity.  Eating habits.  Work and work Statistician.  Method of birth control.  Menstrual cycle.  Pregnancy history. What immunizations do I need?  Influenza (flu) vaccine  This is recommended every year. Tetanus, diphtheria, and pertussis (Tdap) vaccine  You may need a Td booster every 10 years. Varicella (chickenpox) vaccine  You may need this if you have not been  vaccinated. Human papillomavirus (HPV) vaccine  If recommended by your health care provider, you may need three doses over 6 months. Measles, mumps, and rubella (MMR) vaccine  You may need at least one dose of MMR. You may also need a second dose. Meningococcal conjugate (MenACWY) vaccine  One dose is recommended if you are age 26-21 years and a first-year college student living in a residence hall, or if you have one of several medical conditions. You may also need additional booster doses. Pneumococcal conjugate (PCV13) vaccine  You may need this if you have certain conditions and were not previously vaccinated. Pneumococcal polysaccharide (PPSV23) vaccine  You may need one or two doses if you smoke cigarettes or if you have certain conditions. Hepatitis A vaccine  You may need this if you have certain conditions or if you travel or work in places where you may be exposed to hepatitis A. Hepatitis B vaccine  You may need this if you have certain conditions or if you travel or work in places where you may be exposed to hepatitis B. Haemophilus influenzae type b (Hib) vaccine  You may need this if you have certain conditions. You may receive vaccines as individual doses or as more than one vaccine together in one shot (combination vaccines). Talk with your health care provider about the risks and benefits of combination vaccines. What tests do I need?  Blood tests  Lipid and cholesterol levels. These may be checked every 5 years starting at age 34.  Hepatitis C test.  Hepatitis B test. Screening  Diabetes screening. This is done by checking your blood sugar (glucose) after you  have not eaten for a while (fasting).  Sexually transmitted disease (STD) testing.  BRCA-related cancer screening. This may be done if you have a family history of breast, ovarian, tubal, or peritoneal cancers.  Pelvic exam and Pap test. This may be done every 3 years starting at age 34. Starting at  age 34, this may be done every 5 years if you have a Pap test in combination with an HPV test. Talk with your health care provider about your test results, treatment options, and if necessary, the need for more tests. Follow these instructions at home: Eating and drinking   Eat a diet that includes fresh fruits and vegetables, whole grains, lean protein, and low-fat dairy.  Take vitamin and mineral supplements as recommended by your health care provider.  Do not drink alcohol if: ? Your health care provider tells you not to drink. ? You are pregnant, may be pregnant, or are planning to become pregnant.  If you drink alcohol: ? Limit how much you have to 0-1 drink a day. ? Be aware of how much alcohol is in your drink. In the [place] one drink equals one 12 oz bottle of beer (355 mL), one 5 oz glass of wine (148 mL), or one 1 oz glass of hard liquor (44 mL). Lifestyle  Take daily care of your teeth and gums.  Stay active. Exercise for at least 30 minutes on 5 or more days each week.  Do not use any products that contain nicotine or tobacco, such as cigarettes, e-cigarettes, and chewing tobacco. If you need help quitting, ask your health care provider.  If you are sexually active, practice safe sex. Use a condom or other form of birth control (contraception) in order to prevent pregnancy and STIs (sexually transmitted infections). If you plan to become pregnant, see your health care provider for a preconception visit. What's next?  Visit your health care provider once a year for a well check visit.  Ask your health care provider how often you should have your eyes and teeth checked.  Stay up to date on all vaccines. This information is not intended to replace advice given to you by your health care provider. Make sure you discuss any questions you have with your health care provider. Document Revised: 07/09/2018 Document Reviewed: 07/09/2018 Elsevier Patient Education  2020 [employer].

## 2020-03-23 NOTE — Assessment & Plan Note (Signed)
Compliant to rosuvastatin 5 mg, recently lipid panel reviewed, continue same.

## 2020-03-23 NOTE — Assessment & Plan Note (Signed)
To plantar feet bilaterally x 1 week. Already on gabapentin, doubt neuropathy.  Could be plantar fasciitis. Discussed conservative treatment with exercises and NSAID's. She will update.

## 2020-03-23 NOTE — Assessment & Plan Note (Signed)
Immunizations UTD. Pap smear UTD. Encouraged weight loss through diet and exercise. Exam today as noted. Labs from February 2021 reviewed.

## 2020-03-31 ENCOUNTER — Encounter: Payer: Self-pay | Admitting: Primary Care

## 2020-03-31 ENCOUNTER — Other Ambulatory Visit: Payer: Self-pay

## 2020-03-31 ENCOUNTER — Ambulatory Visit: Payer: BC Managed Care – PPO | Admitting: Primary Care

## 2020-03-31 VITALS — BP 122/74 | HR 88 | Temp 96.5°F | Ht 68.0 in | Wt 289.0 lb

## 2020-03-31 DIAGNOSIS — R200 Anesthesia of skin: Secondary | ICD-10-CM | POA: Diagnosis not present

## 2020-03-31 DIAGNOSIS — R252 Cramp and spasm: Secondary | ICD-10-CM | POA: Diagnosis not present

## 2020-03-31 DIAGNOSIS — R55 Syncope and collapse: Secondary | ICD-10-CM | POA: Diagnosis not present

## 2020-03-31 LAB — MAGNESIUM: Magnesium: 1.9 mg/dL (ref 1.5–2.5)

## 2020-03-31 LAB — VITAMIN B12: Vitamin B-12: 118 pg/mL — ABNORMAL LOW (ref 211–911)

## 2020-03-31 NOTE — Progress Notes (Signed)
Subjective:    Patient ID: Gloria Dyer, female    DOB: Jun 30, 1986, 34 y.o.   MRN: [id number]  HPI  This visit occurred during the SARS-CoV-2 public health emergency.  Safety protocols were in place, including screening questions prior to the visit, additional usage of staff PPE, and extensive cleaning of exam room while observing appropriate contact time as indicated for disinfecting solutions.   Gloria Dyer is a 34 year old female with a history of migraines, recurrent sinusitis, GERD, hyperlipidemia who presents today with a chief complaint of lower extremity cramping and syncope.  She first noticed cramping to bilateral calves and swelling to her left ankle and dorsal foot on April 28th.  Cramping has been intermittent since April 28th, swelling to left foot has been daily. She woke up early Wednesday morning with a cramp to the right posterior calf. She got out of bed to "walk it out" and the next thing she remembers is waking up on her bathroom floor. She's not sure how long she was out. She didn't take notice what time she got out of bed, but did notice it was 3:30 am when she woke up.  She doesn't remember feeling dizziness or weakness prior to her syncopal episode. She has stayed well hydrated in general.   Since then she continues to notice a moderate ache to her right posterior calf, intermittent cramping to bilateral calves. She does have a headache to the frontal and mid parietal lobes since her syncopal episode.   She also feels as though both of her legs are "going to sleep" often throughout the day, regardless of sitting or standing. This began about one month ago, but increased frequency since her episode two mornings ago. She also believes she's noticed left upper extremity numbness intermittently when laying in bed only, occurs with and without laying on her left side. She's also felt "exhausted" since her episode two days ago.   She denies diplopia, facial numbness, back  and neck pain, loss of bowel/bladder control, family history of MS. She does have a FH of stroke in her father.   BP Readings from Last 3 Encounters:  03/31/20 122/74  03/23/20 122/80  01/05/20 130/82     Review of Systems  Constitutional: Positive for fatigue.  Eyes: Negative for visual disturbance.  Respiratory: Negative for shortness of breath.   Cardiovascular: Negative for chest pain and palpitations.  Musculoskeletal: Positive for myalgias.  Neurological: Positive for numbness and headaches. Negative for dizziness.       Past Medical History:  Diagnosis Date  . Endometriosis   . GERD (gastroesophageal reflux disease)   . Hyperlipidemia   . IBS (irritable bowel syndrome)   . Migraines   . 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 Marital status: Married    Spouse name: Not on file  . Number of children: Not on file  . Years of education: Not on file  . Highest education level: Not on file  Occupational History  . Not on file  Tobacco Use  . Smoking status: Never Smoker  . Smokeless tobacco: Never Used  Substance and Sexual Activity  . Alcohol use: No  . Drug use: No  . Sexual activity: Never    Partners: Male    Birth control/protection: OCP  Other Topics Concern  . Not on file  Social History Narrative   Single.   No children.   Works as  a Runner, broadcasting/film/video.   Currently in graduate school.   Social Determinants of Health   Financial Resource Strain:   . Difficulty of Paying Living Expenses:   Food Insecurity:   . Worried About Programme researcher, broadcasting/film/video in the Last Year:   . Barista in the Last Year:   Transportation Needs:   . Freight forwarder (Medical):   Marland Kitchen Lack of Transportation (Non-Medical):   Physical Activity:   . Days of Exercise per Week:   . Minutes of Exercise per Session:   Stress:   . Feeling of Stress :   Social Connections:   . Frequency of Communication with Friends and Family:   . Frequency  of Social Gatherings with Friends and Family:   . Attends Religious Services:   . Active Member of Clubs or Organizations:   . Attends Banker Meetings:   Marland Kitchen Marital Status:   Intimate Partner Violence:   . Fear of Current or Ex-Partner:   . Emotionally Abused:   Marland Kitchen Physically Abused:   . Sexually Abused:     Past Surgical History:  Procedure Laterality Date  . ENDOMETRIAL BIOPSY    . TONSILLECTOMY AND ADENOIDECTOMY      Family History  Problem Relation Age of Onset  . Breast cancer Mother 65       stage 3C  . Asthma Mother   . Thyroid disease Mother   . Graves' disease Mother   . Hyperlipidemia Father   . Stroke Father   . Hypertension Father   . Macular degeneration Father   . Diabetes Paternal Grandmother   . Hyperlipidemia Paternal Grandmother   . Glaucoma Paternal Grandmother   . Alzheimer's disease Paternal Grandmother   . Diabetes Paternal Grandfather   . Hyperlipidemia Paternal Grandfather   . Heart disease Paternal Grandfather   . Hypertension Paternal Grandfather   . Kidney disease Paternal Grandfather   . Macular degeneration Paternal Grandfather   . Eating disorder Sister   . Alzheimer's disease Maternal Grandfather   . Breast cancer Maternal Aunt   . Diabetes Paternal Aunt     No Known Allergies  Current Outpatient Medications on File Prior to Visit  Medication Sig Dispense Refill  . cyclobenzaprine (FLEXERIL) 5 MG tablet TAKE 1 TABLET BY MOUTH EVERYDAY AT BEDTIME 30 tablet 0  . fluticasone (FLONASE) 50 MCG/ACT nasal spray PLACE 1 SPRAY INTO BOTH NOSTRILS 2 (TWO) TIMES DAILY. 16 g 0  . gabapentin (NEURONTIN) 600 MG tablet TAKE 1 TABLET BY MOUTH AT BEDTIME FOR HEADACHE/MIGRAINE PREVENTION 90 tablet 0  . lidocaine (XYLOCAINE) 2 % solution Gargle and swallow 15 ml every 4-6 hours as needed for throat pain. 180 mL 0  . Melatonin 3 MG TABS Take 3 mg by mouth.    . montelukast (SINGULAIR) 10 MG tablet TAKE 1 TABLET BY MOUTH EVERYDAY AT BEDTIME  90 tablet 2  . Norethindrone Acetate-Ethinyl Estradiol (JUNEL 1.5/30) 1.5-30 MG-MCG tablet Take 1 tablet by mouth daily. 3 Package 4  . pantoprazole (PROTONIX) 20 MG tablet TAKE 1 TABLET BY MOUTH EVERY DAY 90 tablet 1  . Probiotic Product (ALIGN PO) Take 1 tablet by mouth.    . rosuvastatin (CRESTOR) 5 MG tablet Take 1 tablet (5 mg total) by mouth daily. For cholesterol. 90 tablet 2  . sertraline (ZOLOFT) 25 MG tablet Take 1 tablet (25 mg total) by mouth daily. For anxiety. 90 tablet 1   No current facility-administered medications on file prior to visit.  BP 122/74   Pulse 88   Temp (!) 96.5 F (35.8 C) (Temporal)   Ht 5\' 8"  (1.727 m)   Wt 289 lb (131.1 kg)   SpO2 98%   BMI 43.94 kg/m    Objective:   Physical Exam  Constitutional: She is oriented to person, place, and time. She appears well-nourished.  Eyes: EOM are normal.  Cardiovascular: Normal rate and regular rhythm.  Respiratory: Effort normal and breath sounds normal.  Musculoskeletal:        General: Normal range of motion.     Cervical back: Neck supple.     Comments: 5/5 strength to bilateral upper and lower extremities.   Neurological: She is alert and oriented to person, place, and time. No cranial nerve deficit.  Skin: Skin is warm and dry.  Psychiatric: She has a normal mood and affect.           Assessment & Plan:

## 2020-03-31 NOTE — Assessment & Plan Note (Addendum)
Bilaterally, intermittent for one month, now more frequent and with a syncopal episode. Also with symptoms of lower extremity cramping, left upper extremity numbness.  Symptoms suspicious for neurological cause, cannot rule out MS. Also consider statin therapy to be contributing.   Will check labs today. Hold Crestor. Consider brain imaging if symptoms persist after 2 week statin hold. Will consult with supervising physician.

## 2020-03-31 NOTE — Assessment & Plan Note (Addendum)
Recent occurrence with moderate to severe right calf cramping.  ECG today with NSR with rate of 75. No PAC/PVC, ST changes. No old ECG to compare.   Negative orthostatics.  Hold Crestor. Will consult with supervising physician.

## 2020-03-31 NOTE — Patient Instructions (Addendum)
Stop by the lab prior to leaving today. I will notify you of your results once received.   I will be in touch regarding further testing later this afternoon.  Ensure you are consuming 64 ounces of water daily.  Stretch your calves before bed and upon waking.   Stop taking rosuvastatin (Crestor). Update me in 1-2 weeks.   It was a pleasure to see you today!

## 2020-03-31 NOTE — Assessment & Plan Note (Addendum)
Intermittent for nearly one month. Lab work up last visit without any obvious cause. No over exertion.  Checking B12 and magnesium labs.  Encouraged stretching twice daily, everyday. Also encouraged plenty of water intake.   Hold statin for 2 weeks. She will update.

## 2020-05-05 ENCOUNTER — Other Ambulatory Visit: Payer: Self-pay | Admitting: Primary Care

## 2020-05-05 DIAGNOSIS — K219 Gastro-esophageal reflux disease without esophagitis: Secondary | ICD-10-CM

## 2020-05-16 ENCOUNTER — Other Ambulatory Visit: Payer: Self-pay | Admitting: Primary Care

## 2020-05-16 DIAGNOSIS — F411 Generalized anxiety disorder: Secondary | ICD-10-CM

## 2020-05-16 DIAGNOSIS — E538 Deficiency of other specified B group vitamins: Secondary | ICD-10-CM

## 2020-05-16 DIAGNOSIS — E7800 Pure hypercholesterolemia, unspecified: Secondary | ICD-10-CM

## 2020-05-18 ENCOUNTER — Other Ambulatory Visit (INDEPENDENT_AMBULATORY_CARE_PROVIDER_SITE_OTHER): Payer: BC Managed Care – PPO

## 2020-05-18 ENCOUNTER — Other Ambulatory Visit: Payer: Self-pay

## 2020-05-18 DIAGNOSIS — E7800 Pure hypercholesterolemia, unspecified: Secondary | ICD-10-CM | POA: Diagnosis not present

## 2020-05-18 DIAGNOSIS — E538 Deficiency of other specified B group vitamins: Secondary | ICD-10-CM | POA: Diagnosis not present

## 2020-05-18 LAB — VITAMIN B12: Vitamin B-12: 193 pg/mL — ABNORMAL LOW (ref 211–911)

## 2020-05-18 LAB — LIPID PANEL
Cholesterol: 184 mg/dL (ref 0–200)
HDL: 42 mg/dL (ref >39.00)
LDL Cholesterol: 122 mg/dL — ABNORMAL HIGH (ref 0–99)
NonHDL: 141.56
Total CHOL/HDL Ratio: 4
Triglycerides: 96 mg/dL (ref 0.0–149.0)
VLDL: 19.2 mg/dL (ref 0.0–40.0)

## 2020-06-11 ENCOUNTER — Other Ambulatory Visit: Payer: Self-pay | Admitting: Primary Care

## 2020-06-11 DIAGNOSIS — G43901 Migraine, unspecified, not intractable, with status migrainosus: Secondary | ICD-10-CM

## 2020-06-12 ENCOUNTER — Other Ambulatory Visit: Payer: Self-pay

## 2020-06-12 DIAGNOSIS — G43901 Migraine, unspecified, not intractable, with status migrainosus: Secondary | ICD-10-CM

## 2020-06-12 MED ORDER — GABAPENTIN 600 MG PO TABS: ORAL_TABLET | ORAL | 1 refills | Status: DC

## 2020-06-12 NOTE — Telephone Encounter (Signed)
There is a refill request from MyChart from the patient but nothing from the pharmacy.

## 2020-07-21 ENCOUNTER — Other Ambulatory Visit: Payer: Self-pay

## 2020-07-21 ENCOUNTER — Encounter: Payer: Self-pay | Admitting: Primary Care

## 2020-07-21 ENCOUNTER — Ambulatory Visit: Payer: BC Managed Care – PPO | Admitting: Primary Care

## 2020-07-21 VITALS — BP 122/80 | HR 100 | Ht 68.0 in | Wt 304.0 lb

## 2020-07-21 DIAGNOSIS — R200 Anesthesia of skin: Secondary | ICD-10-CM

## 2020-07-21 DIAGNOSIS — E559 Vitamin D deficiency, unspecified: Secondary | ICD-10-CM

## 2020-07-21 DIAGNOSIS — E538 Deficiency of other specified B group vitamins: Secondary | ICD-10-CM

## 2020-07-21 DIAGNOSIS — R252 Cramp and spasm: Secondary | ICD-10-CM

## 2020-07-21 DIAGNOSIS — G43701 Chronic migraine without aura, not intractable, with status migrainosus: Secondary | ICD-10-CM | POA: Diagnosis not present

## 2020-07-21 LAB — BASIC METABOLIC PANEL
BUN: 9 mg/dL (ref 6–23)
CO2: 27 mEq/L (ref 19–32)
Calcium: 8.5 mg/dL (ref 8.4–10.5)
Chloride: 104 mEq/L (ref 96–112)
Creatinine, Ser: 0.63 mg/dL (ref 0.40–1.20)
GFR: 108.19 mL/min (ref >60.00)
Glucose, Bld: 95 mg/dL (ref 70–99)
Potassium: 4.1 mEq/L (ref 3.5–5.1)
Sodium: 137 mEq/L (ref 135–145)

## 2020-07-21 LAB — CBC
HCT: 37.4 % (ref 36.0–46.0)
Hemoglobin: 12.3 g/dL (ref 12.0–15.0)
MCHC: 32.8 g/dL (ref 30.0–36.0)
MCV: 83.6 fl (ref 78.0–100.0)
Platelets: 318 10*3/uL (ref 150.0–400.0)
RBC: 4.47 Mil/uL (ref 3.87–5.11)
RDW: 14.6 % (ref 11.5–15.5)
WBC: 8.6 10*3/uL (ref 4.0–10.5)

## 2020-07-21 LAB — VITAMIN B12: Vitamin B-12: 193 pg/mL — ABNORMAL LOW (ref 211–911)

## 2020-07-21 LAB — VITAMIN D 25 HYDROXY (VIT D DEFICIENCY, FRACTURES): VITD: 16.67 ng/mL — ABNORMAL LOW (ref 30.00–100.00)

## 2020-07-21 NOTE — Patient Instructions (Signed)
Stop by the lab prior to leaving today. I will notify you of your results once received.   Continue vitamin B12 1000 mcg for now.  It was a pleasure to see you today!

## 2020-07-21 NOTE — Assessment & Plan Note (Signed)
Resolved temporarily after discontinuation of Crestor, but recent return which is concerning.  Exam today overall benign, but she does have active numbness bilaterally during our visit.   Repeat labs pending. Will likely need to move forward with brain imaging vs neurology consultation. Await results.

## 2020-07-21 NOTE — Progress Notes (Signed)
Subjective:    Patient ID: Gloria Dyer, female    DOB: [birth year], 34 y.o.   MRN: [id number]  HPI  This visit occurred during the SARS-CoV-2 public health emergency.  Safety protocols were in place, including screening questions prior to the visit, additional usage of staff PPE, and extensive cleaning of exam room while observing appropriate contact time as indicated for disinfecting solutions.   Gloria Dyer is a 34 year old female with a history of migraines, syncope, GERD, hyperlipidemia, GAD, vitamin B12 deficiency, lower extremity numbness/cramping who presents today to discuss lower extremity cramping.   Chronic history of intermittent lower extremity cramping for years. Initiated on statin therapy in June of 2017 by prior PCP for CAD prevention given strong FH. She was switched to rosuvastatin about one year ago, but we discontinued in May 2021 due to continued lower extremity cramping.   Since May 2021 cramping mostly resolved until one week ago. About one week ago she developed cramping to her right posterior calf that occucred during the night. She got out of bed and walked, this helped to improve symptoms. The following day her cramping continued intermittently when walking. Also with the same type of cramping to her right neck. Symptoms resolved eventually.   She's also experienced chronic, intermittent numbness that occurs within minutes of sitting. She only notices the numbness with sitting, located to the posterior lower extremities from the thighs to her toes. She denies weakness or falls.   She is compliant to oral B12 1000 mcg daily. She is compliant to her gabapentin 600 mg HS for migraine prevention, has been on this regimen for years which was initiated by a headache specialist. Migraines have improved since on this regimen as prior to she was experiencing migraines several times weekly. Now migraines occur 1-2 times monthly which are minor and typically abate with  cyclobenzaprine.   Review of Systems  Eyes: Negative for visual disturbance.  Respiratory: Negative for shortness of breath.   Cardiovascular: Negative for chest pain.  Musculoskeletal: Positive for myalgias.  Skin: Negative for color change.  Neurological: Positive for numbness. Negative for dizziness and weakness.       Past Medical History:  Diagnosis Date  . Endometriosis   . GERD (gastroesophageal reflux disease)   . Hyperlipidemia   . IBS (irritable bowel syndrome)   . Migraines   . 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 Marital status: Married    Spouse name: Not on file  . Number of children: Not on file  . Years of education: Not on file  . Highest education level: Not on file  Occupational History  . Not on file  Tobacco Use  . Smoking status: Never Smoker  . Smokeless tobacco: Never Used  Substance and Sexual Activity  . Alcohol use: No  . Drug use: No  . Sexual activity: Never    Partners: Male    Birth control/protection: OCP  Other Topics Concern  . Not on file  Social History Narrative   Single.   No children.   Works as a Runner, broadcasting/film/video.   Currently in graduate school.   Social Determinants of Health   Financial Resource Strain:   . Difficulty of Paying Living Expenses: Not on file  Food Insecurity:   . Worried About Programme researcher, broadcasting/film/video in the Last Year: Not on file  . Ran Out of Food in the Last Year: Not  on file  Transportation Needs:   . Lack of Transportation (Medical): Not on file  . Lack of Transportation (Non-Medical): Not on file  Physical Activity:   . Days of Exercise per Week: Not on file  . Minutes of Exercise per Session: Not on file  Stress:   . Feeling of Stress : Not on file  Social Connections:   . Frequency of Communication with Friends and Family: Not on file  . Frequency of Social Gatherings with Friends and Family: Not on file  . Attends Religious Services: Not on file  . Active  Member of Clubs or Organizations: Not on file  . Attends Banker Meetings: Not on file  . Marital Status: Not on file  Intimate Partner Violence:   . Fear of Current or Ex-Partner: Not on file  . Emotionally Abused: Not on file  . Physically Abused: Not on file  . Sexually Abused: Not on file    Past Surgical History:  Procedure Laterality Date  . ENDOMETRIAL BIOPSY    . TONSILLECTOMY AND ADENOIDECTOMY      Family History  Problem Relation Age of Onset  . Breast cancer Mother 71       stage 3C  . Asthma Mother   . Thyroid disease Mother   . Graves' disease Mother   . Hyperlipidemia Father   . Stroke Father   . Hypertension Father   . Macular degeneration Father   . Diabetes Paternal Grandmother   . Hyperlipidemia Paternal Grandmother   . Glaucoma Paternal Grandmother   . Alzheimer's disease Paternal Grandmother   . Diabetes Paternal Grandfather   . Hyperlipidemia Paternal Grandfather   . Heart disease Paternal Grandfather   . Hypertension Paternal Grandfather   . Kidney disease Paternal Grandfather   . Macular degeneration Paternal Grandfather   . Eating disorder Sister   . Alzheimer's disease Maternal Grandfather   . Breast cancer Maternal Aunt   . Diabetes Paternal Aunt     No Known Allergies  Current Outpatient Medications on File Prior to Visit  Medication Sig Dispense Refill  . cyclobenzaprine (FLEXERIL) 5 MG tablet TAKE 1 TABLET BY MOUTH EVERYDAY AT BEDTIME 30 tablet 0  . fluticasone (FLONASE) 50 MCG/ACT nasal spray PLACE 1 SPRAY INTO BOTH NOSTRILS 2 (TWO) TIMES DAILY. 16 g 0  . gabapentin (NEURONTIN) 600 MG tablet TAKE 1 TABLET BY MOUTH AT BEDTIME FOR HEADACHE/MIGRAINE PREVENTION 90 tablet 1  . lidocaine (XYLOCAINE) 2 % solution Gargle and swallow 15 ml every 4-6 hours as needed for throat pain. 180 mL 0  . Melatonin 3 MG TABS Take 3 mg by mouth.    . montelukast (SINGULAIR) 10 MG tablet TAKE 1 TABLET BY MOUTH EVERYDAY AT BEDTIME 90 tablet 2  .  Norethindrone Acetate-Ethinyl Estradiol (JUNEL 1.5/30) 1.5-30 MG-MCG tablet Take 1 tablet by mouth daily. 3 Package 4  . pantoprazole (PROTONIX) 20 MG tablet TAKE 1 TABLET BY MOUTH EVERY DAY 90 tablet 2  . Probiotic Product (ALIGN PO) Take 1 tablet by mouth.    . sertraline (ZOLOFT) 25 MG tablet TAKE 1 TABLET (25 MG TOTAL) BY MOUTH DAILY. FOR ANXIETY. 90 tablet 1   No current facility-administered medications on file prior to visit.    BP 122/80   Pulse 100   Ht 5\' 8"  (1.727 m)   Wt (!) 304 lb (137.9 kg)   SpO2 98%   BMI 46.22 kg/m    Objective:   Physical Exam Cardiovascular:     Rate  and Rhythm: Normal rate and regular rhythm.  Pulmonary:     Effort: Pulmonary effort is normal.     Breath sounds: Normal breath sounds.  Musculoskeletal:     Comments: 5/5 strength to bilateral lower extremities. Negative straight leg raise bilaterally.   Skin:    General: Skin is warm and dry.  Neurological:     Mental Status: She is alert.  Psychiatric:        Mood and Affect: Mood normal.            Assessment & Plan:

## 2020-07-21 NOTE — Assessment & Plan Note (Signed)
Chronic and continued, daily within minutes of sitting.  Could be secondary to her body habitus as she is morbidly obese. Need to rule out neurological cause.  Repeat labs pending. Will likely need MR brain vs neurology consultation. Await results.

## 2020-07-21 NOTE — Assessment & Plan Note (Signed)
Overall improved on regimen of gabapentin HS which was initiated by headache specialist. Continue same for now, it doesn't seem as this is contributing to symptoms today.

## 2020-07-23 MED ORDER — VITAMIN D (ERGOCALCIFEROL) 1.25 MG (50000 UNIT) PO CAPS: ORAL_CAPSULE | ORAL | 0 refills | Status: DC

## 2020-07-25 NOTE — Telephone Encounter (Signed)
Joellen or Alice, please set her up for B12 injection visits. Every 2 weeks x 1 month, then monthly times another 2 months. We need a lab appointment scheduled for 2 weeks after the last injection.

## 2020-07-25 NOTE — Telephone Encounter (Signed)
Called patient to schedule B-12 injections. LVM to call back and schedule.

## 2020-07-26 ENCOUNTER — Other Ambulatory Visit: Payer: Self-pay

## 2020-07-26 ENCOUNTER — Ambulatory Visit (INDEPENDENT_AMBULATORY_CARE_PROVIDER_SITE_OTHER): Payer: BC Managed Care – PPO

## 2020-07-26 DIAGNOSIS — E538 Deficiency of other specified B group vitamins: Secondary | ICD-10-CM | POA: Diagnosis not present

## 2020-07-26 MED ORDER — CYANOCOBALAMIN 1000 MCG/ML IJ SOLN
1000.0000 ug | Freq: Once | INTRAMUSCULAR | Status: AC
  Administered 2020-07-26: 1000 ug via INTRAMUSCULAR

## 2020-07-26 NOTE — Progress Notes (Signed)
Per orders of Vernona Rieger, NP, injection of B12 Left deltoid given by Roena Malady.  Patient tolerated injection well.

## 2020-07-28 NOTE — Telephone Encounter (Signed)
Viewed schedule and patient scheduled for B-12 injections.

## 2020-08-09 ENCOUNTER — Other Ambulatory Visit: Payer: Self-pay

## 2020-08-09 ENCOUNTER — Ambulatory Visit (INDEPENDENT_AMBULATORY_CARE_PROVIDER_SITE_OTHER): Payer: BC Managed Care – PPO | Admitting: *Deleted

## 2020-08-09 DIAGNOSIS — E538 Deficiency of other specified B group vitamins: Secondary | ICD-10-CM

## 2020-08-09 MED ORDER — CYANOCOBALAMIN 1000 MCG/ML IJ SOLN
1000.0000 ug | Freq: Once | INTRAMUSCULAR | Status: AC
  Administered 2020-08-09: 1000 ug via INTRAMUSCULAR

## 2020-08-09 NOTE — Progress Notes (Signed)
Per orders of Kate Clark, NP, injection of B12 given by Kortney Schoenfelder M. Patient tolerated injection well. 

## 2020-08-15 ENCOUNTER — Ambulatory Visit: Payer: BC Managed Care – PPO

## 2020-08-20 ENCOUNTER — Ambulatory Visit
Admission: RE | Admit: 2020-08-20 | Discharge: 2020-08-20 | Disposition: A | Discharge status: Home / Self Care | Payer: BC Managed Care – PPO | Source: Ambulatory Visit | Attending: Primary Care | Admitting: Primary Care

## 2020-08-20 DIAGNOSIS — R252 Cramp and spasm: Secondary | ICD-10-CM

## 2020-08-20 DIAGNOSIS — R200 Anesthesia of skin: Secondary | ICD-10-CM

## 2020-08-20 MED ORDER — GADOBENATE DIMEGLUMINE 529 MG/ML IV SOLN
20.0000 mL | Freq: Once | INTRAVENOUS | Status: AC | PRN
  Administered 2020-08-20: 20 mL via INTRAVENOUS

## 2020-09-19 ENCOUNTER — Other Ambulatory Visit: Payer: Self-pay | Admitting: Primary Care

## 2020-09-19 DIAGNOSIS — J329 Chronic sinusitis, unspecified: Secondary | ICD-10-CM

## 2020-09-23 ENCOUNTER — Other Ambulatory Visit: Payer: Self-pay | Admitting: Family Medicine

## 2020-09-23 DIAGNOSIS — Z0000 Encounter for general adult medical examination without abnormal findings: Secondary | ICD-10-CM

## 2020-09-28 ENCOUNTER — Ambulatory Visit (INDEPENDENT_AMBULATORY_CARE_PROVIDER_SITE_OTHER): Payer: BC Managed Care – PPO

## 2020-09-28 ENCOUNTER — Other Ambulatory Visit: Payer: Self-pay

## 2020-09-28 DIAGNOSIS — E538 Deficiency of other specified B group vitamins: Secondary | ICD-10-CM | POA: Diagnosis not present

## 2020-09-28 MED ORDER — CYANOCOBALAMIN 1000 MCG/ML IJ SOLN
1000.0000 ug | Freq: Once | INTRAMUSCULAR | Status: AC
  Administered 2020-09-28: 1000 ug via INTRAMUSCULAR

## 2020-09-28 NOTE — Progress Notes (Signed)
Per orders of Mayra Reel, NP, injection of B12, given by Selina Cooley, RN. Patient tolerated injection well in L Deltiod.

## 2020-10-08 ENCOUNTER — Other Ambulatory Visit: Payer: Self-pay | Admitting: Primary Care

## 2020-10-08 DIAGNOSIS — E559 Vitamin D deficiency, unspecified: Secondary | ICD-10-CM

## 2020-10-09 NOTE — Telephone Encounter (Signed)
Patient has finished weekly script. Do you want her to take daily otc?

## 2020-10-09 NOTE — Telephone Encounter (Signed)
Recent vitamin D level of 18 which was noted in care everywhere from 10/02/2020. Her original level was 16 in September 2021, so not much improvement.  Was she taking the vitamin D capsules once weekly? If so we need to increase the dose, but find out if she was actually taking.

## 2020-10-10 NOTE — Telephone Encounter (Signed)
Patient called back and stated that she spoke with the neurologist yesterday who told her to stop taking this prescription and start taking Vitamin D OTC daily. Patient is wanting to know if there is a certain one that you recommend for her to take?

## 2020-10-10 NOTE — Telephone Encounter (Signed)
Called and left voicemail for patient to return call regarding medication refill.  °

## 2020-10-10 NOTE — Telephone Encounter (Signed)
If her neurologist doesn't recommend the 50,000 IU dose weekly, then she needs to notify them that her current dose of 1000 IU is not effective. Her repeat D check was 18 recently, last check was 16 in September 2021. Ideally she should be around 50.

## 2020-10-12 ENCOUNTER — Other Ambulatory Visit: Payer: Self-pay | Admitting: Primary Care

## 2020-10-12 DIAGNOSIS — F411 Generalized anxiety disorder: Secondary | ICD-10-CM

## 2020-10-13 NOTE — Telephone Encounter (Signed)
Pharmacy requests refill on: Sertraline HCL 25 mg   LAST REFILL: 05/16/2020 (Q-90, R-1) LAST OV: 07/21/2020 NEXT OV: Not Scheduled  PHARMACY: [facility] [phone] in [employer] [place], [place]

## 2020-10-13 NOTE — Telephone Encounter (Signed)
Refills sent to pharmacy. 

## 2020-10-19 ENCOUNTER — Other Ambulatory Visit: Payer: Self-pay

## 2020-10-19 ENCOUNTER — Encounter: Payer: BC Managed Care – PPO | Attending: Primary Care | Admitting: Dietician

## 2020-10-19 ENCOUNTER — Encounter: Payer: Self-pay | Admitting: Dietician

## 2020-10-19 DIAGNOSIS — E669 Obesity, unspecified: Secondary | ICD-10-CM | POA: Insufficient documentation

## 2020-10-19 NOTE — Patient Instructions (Signed)
   Aim to eat at least 3 times every day. Make sure you have good snack options on hand in case you are too busy to get lunch.   Increase fluid intake to at least 64 ounces daily (with at least half of that, or 32 ounces, being plain water.)

## 2020-10-19 NOTE — Progress Notes (Signed)
Medical Nutrition Therapy   Primary concerns today: weight management   Referral diagnosis: E66.01- obesity  Preferred learning style: no preference indicated Learning readiness: ready   NUTRITION ASSESSMENT   Anthropometrics  Weight: 307.8 lbs   BMI: 46.8 kg/m  Clinical Medical Hx: obesity, GERD, hyperlipidemia, IBS  Medications: see chart  Notable Signs/Symptoms: diarrhea after eating certain vegetables   Lifestyle & Dietary Hx Patient works as an Optician, dispensing. States she would like to lose weight. States [employer] (original program) worked well for her, lost ~100 lbs in high school and tried again later on and was able to lose weight again by calorie counting and knowing her "limits" per day. States that Weight Watchers' new program does not work for her and she is gaining weight by following it. Typical meal pattern is 2-3 meals per day and does not snack primarily due to busy work schedule. May miss lunch 50% of the time. May have Jimmy [employer]'s, [employer]'s, grilled chicken sandwich. States she does not digest some vegetables (especially leafy green vegetables) as they cause diarrhea and she does not care for how they taste. States she may drink 1 bottle of water or 1 sprite per day.   Estimated daily fluid intake: 16 oz Supplements: B12 injection, probiotic Sleep: not much, not well  Stress / self-care: quite stressed  Current average weekly physical activity: none  24-Hr Dietary Recall First Meal: Honey Bunches of Oats  Snack: - Second Meal: "adult Lunchable"   Snack: - Third Meal: Z[employer]'s boneless wings  Snack: - Beverages: water, sprite    NUTRITION DIAGNOSIS  Excessive energy intake (NI-1.3) related to poor food choices as evidenced by patient reported dietary intake reflecting high calorie foods, reported weight gain, and referral for nutritional weight management.    NUTRITION INTERVENTION  Nutrition education (E-1) on the following topics:  . Weight  management nutrition therapy  . Balanced eating   Handouts Provided Include   MyPlate & Meal Ideas   Breakfast Ideas  Balanced Snacks   Learning Style & Readiness for Change Teaching method utilized: Visual & Auditory  Demonstrated degree of understanding via: Teach Back  Barriers to learning/adherence to lifestyle change: Busy Lifestyle   Goals Established by Pt . Aim to eat at least 3 times per day . Increase fluid intake to at least 64 ounces per day (with at least half being plain water)    MONITORING & EVALUATION Dietary intake, weekly physical activity, and goals PRN.  Next Steps  Patient is to contact [facility] to schedule follow up visit.

## 2020-10-30 ENCOUNTER — Encounter: Payer: Self-pay | Admitting: Radiology

## 2020-11-08 ENCOUNTER — Other Ambulatory Visit: Payer: Self-pay | Admitting: Family Medicine

## 2020-11-08 DIAGNOSIS — Z0000 Encounter for general adult medical examination without abnormal findings: Secondary | ICD-10-CM

## 2020-11-08 MED ORDER — NORETHINDRONE ACET-ETHINYL EST 1.5-30 MG-MCG PO TABS: 1.0000 | ORAL_TABLET | Freq: Every day | ORAL | 2 refills | Status: DC

## 2020-11-13 ENCOUNTER — Encounter: Payer: Self-pay | Admitting: Family Medicine

## 2020-11-14 NOTE — Progress Notes (Signed)
This encounter was created in error - please disregard.

## 2020-12-22 ENCOUNTER — Other Ambulatory Visit: Payer: Self-pay | Admitting: Primary Care

## 2020-12-22 DIAGNOSIS — J329 Chronic sinusitis, unspecified: Secondary | ICD-10-CM

## 2020-12-26 NOTE — Progress Notes (Signed)
Approved.  

## 2021-01-11 ENCOUNTER — Encounter: Payer: Self-pay | Admitting: Primary Care

## 2021-01-11 ENCOUNTER — Ambulatory Visit: Payer: BC Managed Care – PPO | Admitting: Primary Care

## 2021-01-11 ENCOUNTER — Other Ambulatory Visit: Payer: Self-pay

## 2021-01-11 VITALS — BP 124/82 | HR 84 | Temp 98.6°F | Ht 68.0 in | Wt 309.0 lb

## 2021-01-11 DIAGNOSIS — R200 Anesthesia of skin: Secondary | ICD-10-CM

## 2021-01-11 DIAGNOSIS — E538 Deficiency of other specified B group vitamins: Secondary | ICD-10-CM

## 2021-01-11 DIAGNOSIS — G43701 Chronic migraine without aura, not intractable, with status migrainosus: Secondary | ICD-10-CM

## 2021-01-11 DIAGNOSIS — Z23 Encounter for immunization: Secondary | ICD-10-CM

## 2021-01-11 MED ORDER — GABAPENTIN 300 MG PO CAPS: ORAL_CAPSULE | ORAL | 0 refills | Status: DC

## 2021-01-11 MED ORDER — CYANOCOBALAMIN 1000 MCG/ML IJ SOLN: INTRAMUSCULAR | 0 refills | Status: DC

## 2021-01-11 MED ORDER — "SYRINGE/NEEDLE (DISP) 23G X 1"" 1 ML MISC": 0 refills | Status: DC

## 2021-01-11 NOTE — Patient Instructions (Addendum)
Resume your B12 injections, inject 1 ml into the muscle once a week for 4 weeks.  Continue your oral B12 1000 mcg tablets.  Wean off of topiramate, take 1/2 tablet by mouth nightly for 1 week, then stop.  Start gabapentin 300 mg at bedtime for migraine prevention.  Okay to increase to 2 capsules at night after 2 weeks if needed.  Schedule follow-up visit for 3 months for migraines and B12.  It was a pleasure to see you today!      Influenza (Flu) Vaccine (Inactivated or Recombinant): What You Need to Know 1. Why get vaccinated? Influenza vaccine can prevent influenza (flu). Flu is a contagious disease that spreads around the [place] every year, usually between October and May. Anyone can get the flu, but it is more dangerous for some people. Infants and young children, people 32 years and older, pregnant people, and people with certain health conditions or a weakened immune system are at greatest risk of flu complications. Pneumonia, bronchitis, sinus infections, and ear infections are examples of flu-related complications. If you have a medical condition, such as heart disease, cancer, or diabetes, flu can make it worse. Flu can cause fever and chills, sore throat, muscle aches, fatigue, cough, headache, and runny or stuffy nose. Some people may have vomiting and diarrhea, though this is more common in children than adults. In an average year, thousands of people in the [place] die from flu, and many more are hospitalized. Flu vaccine prevents millions of illnesses and flu-related visits to the doctor each year. 2. Influenza vaccines CDC recommends everyone 6 months and older get vaccinated every flu season. Children 6 months through 60 years of age may need 2 doses during a single flu season. Everyone else needs only 1 dose each flu season. It takes about 2 weeks for protection to develop after vaccination. There are many flu viruses, and they are always changing. Each year a  new flu vaccine is made to protect against the influenza viruses believed to be likely to cause disease in the upcoming flu season. Even when the vaccine doesn't exactly match these viruses, it may still provide some protection. Influenza vaccine does not cause flu. Influenza vaccine may be given at the same time as other vaccines. 3. Talk with your health care provider Tell your vaccination provider if the person getting the vaccine:  Has had an allergic reaction after a previous dose of influenza vaccine, or has any severe, life-threatening allergies  Has ever had Guillain-Barr Syndrome (also called "GBS") In some cases, your health care provider may decide to postpone influenza vaccination until a future visit. Influenza vaccine can be administered at any time during pregnancy. People who are or will be pregnant during influenza season should receive inactivated influenza vaccine. People with minor illnesses, such as a cold, may be vaccinated. People who are moderately or severely ill should usually wait until they recover before getting influenza vaccine. Your health care provider can give you more information. 4. Risks of a vaccine reaction  Soreness, redness, and swelling where the shot is given, fever, muscle aches, and headache can happen after influenza vaccination.  There may be a very small increased risk of Guillain-Barr Syndrome (GBS) after inactivated influenza vaccine (the flu shot). Young children who get the flu shot along with pneumococcal vaccine (PCV13) and/or DTaP vaccine at the same time might be slightly more likely to have a seizure caused by fever. Tell your health care provider if a child who is  getting flu vaccine has ever had a seizure. People sometimes faint after medical procedures, including vaccination. Tell your provider if you feel dizzy or have vision changes or ringing in the ears. As with any medicine, there is a very remote chance of a vaccine causing a  severe allergic reaction, other serious injury, or death. 5. What if there is a serious problem? An allergic reaction could occur after the vaccinated person leaves the clinic. If you see signs of a severe allergic reaction (hives, swelling of the face and throat, difficulty breathing, a fast heartbeat, dizziness, or weakness), call 9-1-1 and get the person to the nearest hospital. For other signs that concern you, call your health care provider. Adverse reactions should be reported to the Vaccine Adverse Event Reporting System (VAERS). Your health care provider will usually file this report, or you can do it yourself. Visit the VAERS website at www.vaers.LAgents.no or call 541-599-6698. VAERS is only for reporting reactions, and VAERS staff members do not give medical advice. 6. The National Vaccine Injury Compensation Program The [employer] Vaccine Injury Compensation Program (VICP) is a federal program that was created to compensate people who may have been injured by certain vaccines. Claims regarding alleged injury or death due to vaccination have a time limit for filing, which may be as short as two years. Visit the VICP website at SpiritualWord.at or call (978) 043-4514 to learn about the program and about filing a claim. 7. How can I learn more?  Ask your health care provider.  Call your local or state health department.  Visit the website of the Food and Drug Administration (FDA) for vaccine package inserts and additional information at FinderList.no.  Contact the Centers for Disease Control and Prevention (CDC): ? Call 405-139-4905 (1-800-CDC-INFO) or ? Visit CDC's website at BiotechRoom.com.cy. Vaccine Information Statement Inactivated Influenza Vaccine (06/16/2020) This information is not intended to replace advice given to you by your health care provider. Make sure you discuss any questions you have with your health care provider. Document  Revised: 08/03/2020 Document Reviewed: 08/03/2020 Elsevier Patient Education  2021 [employer].

## 2021-01-11 NOTE — Assessment & Plan Note (Signed)
Evaluated by neurology who suspected B12 deficiency to be contributing.  We will work to increase and stabilize B12.

## 2021-01-11 NOTE — Progress Notes (Signed)
Subjective:    Patient ID: Gloria Dyer, female    DOB: Jan 10, 1986, 35 y.o.   MRN: [id number]  HPI  This visit occurred during the SARS-CoV-2 public health emergency.  Safety protocols were in place, including screening questions prior to the visit, additional usage of staff PPE, and extensive cleaning of exam room while observing appropriate contact time as indicated for disinfecting solutions.   Ms. Hantz is a 35 year old female with a history of migraines, recurrent sinusitis, GERD, GAD, vitamin B12 who presents today to discuss migraines and also for follow-up vitamin B12 deficiency.  1) Migraines: Currently following with neurology through [id number] clinic, last visit on 12/22/2020.  Currently managed on topiramate 100 mg daily, rizatriptan 10 mg as needed.  Today she endorses that her headaches are no better and in fact increasing in frequency and intensity. She's had a migraine everyday for the last 7 days. She felt better when managed on gabapentin 600 mg HS which also helped her sleep, she would like to resume. Her migraine has been located to the right frontal lobe with photophobia.  2) Vitamin B12 Deficiency: Currently managed on oral vitamin B12 1000 mcg for which she just resumed 3 weeks ago.  Previously on injections for a short duration of time per her neurologist.  Last B12 level of 221 in mid February 2022.  She does continue to struggle with paresthesias, was also suspected to have carpal tunnel syndrome.  Review of Systems  Eyes: Positive for photophobia.  Neurological: Positive for numbness and headaches.       Past Medical History:  Diagnosis Date  . Endometriosis   . GERD (gastroesophageal reflux disease)   . Hyperlipidemia   . IBS (irritable bowel syndrome)   . Migraines   . 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 Marital status: Married    Spouse name: Not on file  . Number of children: Not on file  . Years of  education: Not on file  . Highest education level: Not on file  Occupational History  . Not on file  Tobacco Use  . Smoking status: Never Smoker  . Smokeless tobacco: Never Used  Substance and Sexual Activity  . Alcohol use: No  . Drug use: No  . Sexual activity: Never    Partners: Male    Birth control/protection: OCP  Other Topics Concern  . Not on file  Social History Narrative   Single.   No children.   Works as a Runner, broadcasting/film/video.   Currently in graduate school.   Social Determinants of Health   Financial Resource Strain: Not on file  Food Insecurity: No Food Insecurity  . Worried About Programme researcher, broadcasting/film/video in the Last Year: Never true  . Ran Out of Food in the Last Year: Never true  Transportation Needs: Not on file  Physical Activity: Not on file  Stress: Not on file  Social Connections: Not on file  Intimate Partner Violence: Not on file    Past Surgical History:  Procedure Laterality Date  . ENDOMETRIAL BIOPSY    . TONSILLECTOMY AND ADENOIDECTOMY      Family History  Problem Relation Age of Onset  . Breast cancer Mother 69       stage 3C  . Asthma Mother   . Thyroid disease Mother   . Graves' disease Mother   . Hyperlipidemia Father   . Stroke Father   . Hypertension Father   .  Macular degeneration Father   . Diabetes Paternal Grandmother   . Hyperlipidemia Paternal Grandmother   . Glaucoma Paternal Grandmother   . Alzheimer's disease Paternal Grandmother   . Diabetes Paternal Grandfather   . Hyperlipidemia Paternal Grandfather   . Heart disease Paternal Grandfather   . Hypertension Paternal Grandfather   . Kidney disease Paternal Grandfather   . Macular degeneration Paternal Grandfather   . Eating disorder Sister   . Alzheimer's disease Maternal Grandfather   . Breast cancer Maternal Aunt   . Diabetes Paternal Aunt     No Known Allergies  Current Outpatient Medications on File Prior to Visit  Medication Sig Dispense Refill  . fluticasone  (FLONASE) 50 MCG/ACT nasal spray PLACE 1 SPRAY INTO BOTH NOSTRILS 2 (TWO) TIMES DAILY. 16 g 0  . Melatonin 3 MG TABS Take 3 mg by mouth.    . montelukast (SINGULAIR) 10 MG tablet TAKE 1 TABLET BY MOUTH EVERYDAY AT BEDTIME 90 tablet 0  . Norethindrone Acetate-Ethinyl Estradiol (JUNEL 1.5/30) 1.5-30 MG-MCG tablet Take 1 tablet by mouth daily. 84 tablet 2  . pantoprazole (PROTONIX) 20 MG tablet TAKE 1 TABLET BY MOUTH EVERY DAY 90 tablet 2  . Probiotic Product (ALIGN PO) Take 1 tablet by mouth.    . sertraline (ZOLOFT) 25 MG tablet TAKE 1 TABLET (25 MG TOTAL) BY MOUTH DAILY. FOR ANXIETY. 90 tablet 1  . Vitamin D, Ergocalciferol, (DRISDOL) 1.25 MG (50000 UNIT) CAPS capsule Take 1 capsule by mouth once weekly for vitamin D. 12 capsule 0  . cyclobenzaprine (FLEXERIL) 5 MG tablet TAKE 1 TABLET BY MOUTH EVERYDAY AT BEDTIME (Patient not taking: Reported on 01/11/2021) 30 tablet 0  . gabapentin (NEURONTIN) 600 MG tablet TAKE 1 TABLET BY MOUTH AT BEDTIME FOR HEADACHE/MIGRAINE PREVENTION (Patient not taking: Reported on 01/11/2021) 90 tablet 1   No current facility-administered medications on file prior to visit.    BP 124/82   Pulse 84   Temp 98.6 F (37 C) (Temporal)   Ht 5\' 8"  (1.727 m)   Wt (!) 309 lb (140.2 kg)   SpO2 98%   BMI 46.98 kg/m    Objective:   Physical Exam Constitutional:      Appearance: She is well-nourished.  Cardiovascular:     Rate and Rhythm: Normal rate and regular rhythm.  Pulmonary:     Effort: Pulmonary effort is normal.     Breath sounds: Normal breath sounds.  Musculoskeletal:     Cervical back: Neck supple.  Skin:    General: Skin is warm and dry.  Psychiatric:        Mood and Affect: Mood and affect and mood normal.            Assessment & Plan:

## 2021-01-11 NOTE — Assessment & Plan Note (Addendum)
Following with neurology, feeling worse on topiramate 100 mg.  We will taper off topiramate and resume gabapentin with 300 mg at bedtime x1-2 week, then increase to 600 mg thereafter if needed.  She will update.  We offered Toradol IM 60 mg today, for which she kindly declines.

## 2021-01-11 NOTE — Assessment & Plan Note (Signed)
Recent level of 221, patient recently resumed 1000 mcg daily.  We will boost her with weekly injections x1 month, continue 1000 g daily.  We will plan to see her back in 2 to 3 months for B12 check and follow-up

## 2021-02-01 ENCOUNTER — Other Ambulatory Visit: Payer: Self-pay | Admitting: Primary Care

## 2021-02-01 DIAGNOSIS — E538 Deficiency of other specified B group vitamins: Secondary | ICD-10-CM

## 2021-02-13 ENCOUNTER — Other Ambulatory Visit: Payer: Self-pay | Admitting: Primary Care

## 2021-02-13 DIAGNOSIS — K219 Gastro-esophageal reflux disease without esophagitis: Secondary | ICD-10-CM

## 2021-02-22 DIAGNOSIS — F411 Generalized anxiety disorder: Secondary | ICD-10-CM

## 2021-02-22 MED ORDER — SERTRALINE HCL 50 MG PO TABS: 50.0000 mg | ORAL_TABLET | Freq: Every day | ORAL | 0 refills | Status: DC

## 2021-03-27 ENCOUNTER — Other Ambulatory Visit: Payer: Self-pay | Admitting: Primary Care

## 2021-03-27 DIAGNOSIS — J329 Chronic sinusitis, unspecified: Secondary | ICD-10-CM

## 2021-04-24 ENCOUNTER — Other Ambulatory Visit: Payer: Self-pay | Admitting: Primary Care

## 2021-04-24 DIAGNOSIS — F411 Generalized anxiety disorder: Secondary | ICD-10-CM

## 2021-05-11 ENCOUNTER — Other Ambulatory Visit: Payer: Self-pay

## 2021-05-11 ENCOUNTER — Ambulatory Visit: Payer: BC Managed Care – PPO | Admitting: Primary Care

## 2021-05-11 VITALS — BP 120/74 | HR 100 | Temp 98.0°F | Resp 16 | Ht 68.0 in | Wt 316.0 lb

## 2021-05-11 DIAGNOSIS — R200 Anesthesia of skin: Secondary | ICD-10-CM | POA: Diagnosis not present

## 2021-05-11 DIAGNOSIS — E559 Vitamin D deficiency, unspecified: Secondary | ICD-10-CM | POA: Diagnosis not present

## 2021-05-11 DIAGNOSIS — G43701 Chronic migraine without aura, not intractable, with status migrainosus: Secondary | ICD-10-CM

## 2021-05-11 DIAGNOSIS — E538 Deficiency of other specified B group vitamins: Secondary | ICD-10-CM

## 2021-05-11 DIAGNOSIS — F411 Generalized anxiety disorder: Secondary | ICD-10-CM

## 2021-05-11 LAB — VITAMIN D 25 HYDROXY (VIT D DEFICIENCY, FRACTURES): VITD: 14.02 ng/mL — ABNORMAL LOW (ref 30.00–100.00)

## 2021-05-11 LAB — VITAMIN B12: Vitamin B-12: 769 pg/mL (ref 211–911)

## 2021-05-11 MED ORDER — CYCLOBENZAPRINE HCL 5 MG PO TABS: ORAL_TABLET | ORAL | 0 refills | Status: AC

## 2021-05-11 NOTE — Assessment & Plan Note (Signed)
Compliant to B12 1000 mcg tablets. Repeat B12 level pending.

## 2021-05-11 NOTE — Patient Instructions (Addendum)
Stop by the lab prior to leaving today. I will notify you of your results once received.   Increase your sertraline (Zoloft) to 1 and 1/2 tablet (75 mg) once daily for about one week, then increase to 2 tablets (100 mg) once daily thereafter. Update me on My Chart as discussed.  Use the cyclobenzaprine as needed for migraines.   It was a pleasure to see you today!

## 2021-05-11 NOTE — Assessment & Plan Note (Signed)
Improved on Zoloft 50 mg, still with mind racing thoughts HS. Increase dose to 75 mg x 1 week, then 100 mg thereafter.  She will update via my chart, will send in 100 mg tablet once she updates. We need to make sure she can tolerate the increased dose.

## 2021-05-11 NOTE — Assessment & Plan Note (Signed)
Improved on gabapentin 600 mg HS, but still having them twice monthly.   Agree to refill her cyclobenzaprine (using PRN) which helps to quickly resolve more severe headaches.   She will update.

## 2021-05-11 NOTE — Progress Notes (Signed)
Subjective:    Patient ID: Gloria Dyer, female    DOB: 03/29/1986, 35 y.o.   MRN: [id number]  HPI  Gloria Dyer is a very pleasant 35 y.o. female with a history of migraines, GERD, hyperlipidemia, GAD, vitamin B12 deficiency, lower extremity numbness who presents today for follow up.  Managed on oral B12 1000 mcg daily. Due for repeat B12 level. Evaluated by neurology who suspected low B12 level to be contributing to symptoms of lower extremity numbness. She continues to notice numbness to the legs, less overall.   Long history of migraines, following with neurology. During her last visit she endorsed feeling worse on topiramate 100 mg so we weaned her off and resumed gabapentin 600 mg which is what she was taking previously. Since her last visit she continues to experience migraines which are occurring twice monthly. Her last migraine was three days ago, improved with Ibuprofen. She is out of her cyclobenzaprine for which she uses for migraine abortion.   She continues to struggle with sleeping, mostly difficulty falling asleep, mind racing thoughts, hard to shut her mind off. She is taking Unisom nightly in order to fall asleep. She is managed on Zoloft 50 mg which has helped some with anxiety.   BP Readings from Last 3 Encounters:  05/11/21 120/74  01/11/21 124/82  07/21/20 122/80     Review of Systems  Cardiovascular:  Negative for chest pain.  Neurological:  Positive for numbness and headaches.  Psychiatric/Behavioral:  Positive for sleep disturbance. The patient is nervous/anxious.         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   No children.   Works as a Runner, broadcasting/film/video.   Currently in graduate school.   Social Determinants of Corporate investment banker Strain: Not on file  Food Insecurity: No Food Insecurity   Worried About Programme researcher, broadcasting/film/video in the Last Year: Never true   Barista in the Last Year: Never true  Transportation Needs: Not on file  Physical Activity: Not on file  Stress: Not on file  Social Connections: Not on file  Intimate Partner Violence: Not on file    Past Surgical History:  Procedure Laterality Date   ENDOMETRIAL BIOPSY     TONSILLECTOMY AND ADENOIDECTOMY      Family History  Problem Relation Age of Onset   Breast cancer Mother 86       stage 3C   Asthma Mother    Thyroid disease Mother    Luiz Blare' disease Mother    Hyperlipidemia Father    Stroke Father    Hypertension Father  Macular degeneration Father    Diabetes Paternal Grandmother    Hyperlipidemia Paternal Grandmother    Glaucoma Paternal Grandmother    Alzheimer's disease Paternal Grandmother    Diabetes Paternal Grandfather    Hyperlipidemia Paternal Grandfather    Heart disease Paternal Grandfather    Hypertension Paternal Grandfather    Kidney disease Paternal Grandfather    Macular degeneration Paternal Grandfather    Eating disorder Sister    Alzheimer's disease Maternal Grandfather    Breast cancer Maternal Aunt    Diabetes Paternal Aunt     No Known Allergies  Current Outpatient Medications on File Prior to Visit  Medication Sig Dispense Refill   cyanocobalamin (,VITAMIN B-12,) 1000 MCG/ML injection Inject 68ml into the muscle once weekly for 4  weeks. 4 mL 0   fluticasone (FLONASE) 50 MCG/ACT nasal spray PLACE 1 SPRAY INTO BOTH NOSTRILS 2 (TWO) TIMES DAILY. 16 g 0   gabapentin (NEURONTIN) 300 MG capsule Take 1 capsule by mouth at bedtime for migraine prevention, may increase to 2 capsules after 2 weeks if needed. 90 capsule 0   Melatonin 3 MG TABS Take 3 mg by mouth.     montelukast (SINGULAIR) 10 MG tablet TAKE 1 TABLET BY MOUTH EVERYDAY AT BEDTIME for allergies. 90 tablet 1   Norethindrone Acetate-Ethinyl Estradiol (JUNEL 1.5/30) 1.5-30 MG-MCG tablet Take 1 tablet by mouth daily. 84 tablet 2   pantoprazole (PROTONIX) 20 MG tablet Take 1 tablet (20 mg total) by mouth daily. For Reflux 90 tablet 3   Probiotic Product (ALIGN PO) Take 1 tablet by mouth.     sertraline (ZOLOFT) 50 MG tablet Take 1 tablet (50 mg total) by mouth daily. For anxiety. 90 tablet 0   SYRINGE/NEEDLE, DISP, 1 ML 23G X 1" 1 ML MISC Use as directed 8 each 0   Vitamin D, Ergocalciferol, (DRISDOL) 1.25 MG (50000 UNIT) CAPS capsule Take 1 capsule by mouth once weekly for vitamin D. 12 capsule 0   No current facility-administered medications on file prior to visit.    BP 120/74   Pulse 100   Temp 98 F (36.7 C)   Resp 16   Ht 5\' 8"  (1.727 m)   Wt (!) 316 lb (143.3 kg)   SpO2 98%   BMI 48.05 kg/m  Objective:   Physical Exam Cardiovascular:     Rate and Rhythm: Normal rate and regular rhythm.  Pulmonary:     Effort: Pulmonary effort is normal.     Breath sounds: Normal breath sounds.  Musculoskeletal:     Cervical back: Neck supple.  Skin:    General: Skin is warm and dry.          Assessment & Plan:      This visit occurred during the SARS-CoV-2 public health emergency.  Safety protocols were in place, including screening questions prior to the visit, additional usage of staff PPE, and extensive cleaning of exam room while observing appropriate contact time as indicated for disinfecting solutions.

## 2021-05-11 NOTE — Assessment & Plan Note (Signed)
Improving with B12 supplementation. Repeat B12 level pending.

## 2021-05-12 ENCOUNTER — Other Ambulatory Visit: Payer: Self-pay | Admitting: Primary Care

## 2021-05-12 DIAGNOSIS — E559 Vitamin D deficiency, unspecified: Secondary | ICD-10-CM

## 2021-05-12 MED ORDER — VITAMIN D (ERGOCALCIFEROL) 1.25 MG (50000 UNIT) PO CAPS: 50000.0000 [IU] | ORAL_CAPSULE | ORAL | 1 refills | Status: DC

## 2021-05-16 DIAGNOSIS — G43701 Chronic migraine without aura, not intractable, with status migrainosus: Secondary | ICD-10-CM

## 2021-05-16 MED ORDER — GABAPENTIN 300 MG PO CAPS: 600.0000 mg | ORAL_CAPSULE | Freq: Every day | ORAL | 3 refills | Status: DC

## 2021-05-23 ENCOUNTER — Other Ambulatory Visit: Payer: Self-pay | Admitting: Primary Care

## 2021-05-23 DIAGNOSIS — F411 Generalized anxiety disorder: Secondary | ICD-10-CM

## 2021-05-25 NOTE — Telephone Encounter (Signed)
Left message to return call to our office.  

## 2021-05-28 NOTE — Telephone Encounter (Signed)
Please clarify what dose pt is starting today?   100 mg? 75mg ? Will want to refill to reflect new dose.

## 2021-05-28 NOTE — Telephone Encounter (Signed)
She is on the 75 now and will increase to 100mg  on Saturday

## 2021-05-28 NOTE — Telephone Encounter (Signed)
FYI

## 2021-05-29 MED ORDER — SERTRALINE HCL 100 MG PO TABS: 100.0000 mg | ORAL_TABLET | Freq: Every day | ORAL | 0 refills | Status: DC

## 2021-05-29 NOTE — Telephone Encounter (Signed)
Noted, will switch to 100 mg dose. See office visit notes.

## 2021-06-16 ENCOUNTER — Other Ambulatory Visit: Payer: Self-pay | Admitting: Primary Care

## 2021-06-16 DIAGNOSIS — F411 Generalized anxiety disorder: Secondary | ICD-10-CM

## 2021-07-29 ENCOUNTER — Other Ambulatory Visit: Payer: Self-pay | Admitting: Family Medicine

## 2021-07-29 DIAGNOSIS — Z0000 Encounter for general adult medical examination without abnormal findings: Secondary | ICD-10-CM

## 2021-08-07 DIAGNOSIS — E538 Deficiency of other specified B group vitamins: Secondary | ICD-10-CM

## 2021-08-07 DIAGNOSIS — E559 Vitamin D deficiency, unspecified: Secondary | ICD-10-CM

## 2021-08-08 NOTE — Telephone Encounter (Signed)
Please advise 

## 2021-08-09 ENCOUNTER — Other Ambulatory Visit (INDEPENDENT_AMBULATORY_CARE_PROVIDER_SITE_OTHER): Payer: BC Managed Care – PPO

## 2021-08-09 ENCOUNTER — Other Ambulatory Visit: Payer: Self-pay

## 2021-08-09 DIAGNOSIS — E559 Vitamin D deficiency, unspecified: Secondary | ICD-10-CM | POA: Diagnosis not present

## 2021-08-09 DIAGNOSIS — E538 Deficiency of other specified B group vitamins: Secondary | ICD-10-CM | POA: Diagnosis not present

## 2021-08-09 LAB — VITAMIN B12: Vitamin B-12: 710 pg/mL (ref 211–911)

## 2021-08-09 LAB — VITAMIN D 25 HYDROXY (VIT D DEFICIENCY, FRACTURES): VITD: 15.52 ng/mL — ABNORMAL LOW (ref 30.00–100.00)

## 2021-08-09 NOTE — Telephone Encounter (Signed)
Please call to schedule lab appt for whichever location she desires. Lab orders are in

## 2021-08-24 ENCOUNTER — Other Ambulatory Visit: Payer: Self-pay | Admitting: *Deleted

## 2021-08-24 DIAGNOSIS — Z0000 Encounter for general adult medical examination without abnormal findings: Secondary | ICD-10-CM

## 2021-08-24 MED ORDER — NORETHINDRONE ACET-ETHINYL EST 1.5-30 MG-MCG PO TABS: 1.0000 | ORAL_TABLET | Freq: Every day | ORAL | 0 refills | Status: DC

## 2021-08-28 ENCOUNTER — Other Ambulatory Visit: Payer: Self-pay | Admitting: Primary Care

## 2021-08-28 DIAGNOSIS — F411 Generalized anxiety disorder: Secondary | ICD-10-CM

## 2021-09-04 ENCOUNTER — Ambulatory Visit: Payer: BC Managed Care – PPO | Admitting: Primary Care

## 2021-09-04 ENCOUNTER — Encounter: Payer: Self-pay | Admitting: Primary Care

## 2021-09-04 ENCOUNTER — Other Ambulatory Visit: Payer: Self-pay

## 2021-09-04 VITALS — BP 136/80 | HR 110 | Temp 98.0°F | Ht 68.0 in | Wt 316.0 lb

## 2021-09-04 DIAGNOSIS — Z23 Encounter for immunization: Secondary | ICD-10-CM

## 2021-09-04 DIAGNOSIS — G43709 Chronic migraine without aura, not intractable, without status migrainosus: Secondary | ICD-10-CM

## 2021-09-04 DIAGNOSIS — F411 Generalized anxiety disorder: Secondary | ICD-10-CM | POA: Diagnosis not present

## 2021-09-04 MED ORDER — SERTRALINE HCL 25 MG PO TABS: 25.0000 mg | ORAL_TABLET | Freq: Every day | ORAL | 0 refills | Status: DC

## 2021-09-04 NOTE — Patient Instructions (Signed)
Start sertraline (Zoloft) 25 mg for anxiety. Take in addition to 100 mg tablet.   Please update in three to four weeks.  It was a pleasure to see you today!

## 2021-09-04 NOTE — Assessment & Plan Note (Signed)
Increased.   Appears to mostly be stress induced, could be weather related.   Will increase Zoloft to 125 mg daily for anxiety.  Continue gabapentin 600 mg HS and cyclobenzaprine 5 mg PRN.  Consider switching migraine regimens if anxiety improves and migraines persist. She agrees and will update.

## 2021-09-04 NOTE — Progress Notes (Signed)
Subjective:    Patient ID: Gloria Dyer, female    DOB: 07-18-1986, 36 y.o.   MRN: [id number]  HPI  Gloria Dyer is a very pleasant 35 y.o. female with a history of chronic migraines, seasonal allergies, recurrent sinusitis who presents today to discuss migraines and insomnia.  1) Migraines: Currently managed on gabapentin 600 mg for migraine prevention and cyclobenzaprine 5 mg PRN. Has been on this regimen for years and done well.   Migraines are occurring once weekly, lasting hours, sometimes most of the day. Over the last month migraines have increased in frequency and intensity. Migraines mostly located to the frontal lobes, right or left. General headaches have been daily, move around her head. She has been using cyclobenzaprine 5 mg once weekly on average, will also use Ibuprofen during the day.   She has been under increased stress with work, feels that Zoloft 100 mg is effective overall during the day but does struggle at night to sleep. She's tried Maxalt for abortive treatment, this made migraines worse.   2) Anxiety/Insomnia: Chronic anxiety, is managed on Zoloft 100 mg, overall has felt improved during her day. Continues to struggle with falling asleep at night, wakes up during the night, hard time falling back asleep. Has mind racing thoughts and worry.  Review of Systems  Neurological:  Positive for headaches.  Psychiatric/Behavioral:  Positive for sleep disturbance. The patient is nervous/anxious.         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   No children.   Works as a Runner, broadcasting/film/video.   Currently in graduate school.   Social Determinants of Corporate investment banker Strain: Not on file  Food Insecurity: No Food Insecurity   Worried About Programme researcher, broadcasting/film/video in the Last Year: Never true   Barista in the Last Year: Never true  Transportation Needs: Not on file  Physical Activity: Not on file  Stress: Not on file  Social Connections: Not on file  Intimate Partner Violence: Not on file    Past Surgical History:  Procedure Laterality Date   ENDOMETRIAL BIOPSY     TONSILLECTOMY AND ADENOIDECTOMY      Family History  Problem Relation Age of Onset   Breast cancer Mother 21       stage 3C   Asthma Mother    Thyroid disease Mother    Luiz Blare' disease Mother    Hyperlipidemia Father    Stroke Father    Hypertension Father    Macular degeneration Father    Diabetes Paternal Grandmother    Hyperlipidemia Paternal Grandmother  Glaucoma Paternal Grandmother    Alzheimer's disease Paternal Grandmother    Diabetes Paternal Grandfather    Hyperlipidemia Paternal Grandfather    Heart disease Paternal Grandfather    Hypertension Paternal Grandfather    Kidney disease Paternal Grandfather    Macular degeneration Paternal Grandfather    Eating disorder Sister    Alzheimer's disease Maternal Grandfather    Breast cancer Maternal Aunt    Diabetes Paternal Aunt     No Known Allergies  Current Outpatient Medications on File Prior to Visit  Medication Sig Dispense Refill   cyanocobalamin 100 MCG tablet Take 100 mcg by mouth daily.     cyclobenzaprine (FLEXERIL) 5 MG tablet Take 1 tablet by mouth daily as needed at migraine onset. 30 tablet 0    fluticasone (FLONASE) 50 MCG/ACT nasal spray PLACE 1 SPRAY INTO BOTH NOSTRILS 2 (TWO) TIMES DAILY. 16 g 0   gabapentin (NEURONTIN) 300 MG capsule Take 2 capsules (600 mg total) by mouth at bedtime. For migraine prevention. 180 capsule 3   Melatonin 3 MG TABS Take 3 mg by mouth.     montelukast (SINGULAIR) 10 MG tablet TAKE 1 TABLET BY MOUTH EVERYDAY AT BEDTIME for allergies. 90 tablet 1   Norethindrone Acetate-Ethinyl Estradiol (JUNEL 1.5/30) 1.5-30 MG-MCG tablet Take 1 tablet by mouth daily. 84 tablet 0   pantoprazole (PROTONIX) 20 MG tablet Take 1 tablet (20 mg total) by mouth daily. For Reflux 90 tablet 3   Probiotic Product (ALIGN PO) Take 1 tablet by mouth.     sertraline (ZOLOFT) 100 MG tablet TAKE 1 TABLET (100 MG TOTAL) BY MOUTH DAILY. FOR ANXIETY. 90 tablet 2   Vitamin D, Ergocalciferol, (DRISDOL) 1.25 MG (50000 UNIT) CAPS capsule Take 1 capsule (50,000 Units total) by mouth every 7 (seven) days. For vitamin D. 12 capsule 1   No current facility-administered medications on file prior to visit.    BP 136/80   Pulse (!) 110   Temp 98 F (36.7 C) (Temporal)   Ht 5\' 8"  (1.727 m)   Wt (!) 316 lb (143.3 kg)   SpO2 97%   BMI 48.05 kg/m  Objective:   Physical Exam Cardiovascular:     Rate and Rhythm: Normal rate and regular rhythm.  Pulmonary:     Effort: Pulmonary effort is normal.     Breath sounds: Normal breath sounds.  Musculoskeletal:     Cervical back: Neck supple.  Skin:    General: Skin is warm and dry.  Psychiatric:        Mood and Affect: Mood normal.          Assessment & Plan:      This visit occurred during the SARS-CoV-2 public health emergency.  Safety protocols were in place, including screening questions prior to the visit, additional usage of staff PPE, and extensive cleaning of exam room while observing appropriate contact time as indicated for disinfecting solutions.

## 2021-09-04 NOTE — Assessment & Plan Note (Signed)
Deteriorated.   Fortunately she's noticed some improvement on Zoloft 100 mg. Will increase to 125 mg by adding 25 mg tablet. She agrees.   She will update in a few weeks.  Consider switching to Celexa if no improvement.

## 2021-09-27 ENCOUNTER — Encounter: Payer: Self-pay | Admitting: Primary Care

## 2021-09-27 ENCOUNTER — Other Ambulatory Visit: Payer: Self-pay

## 2021-09-27 ENCOUNTER — Ambulatory Visit: Payer: BC Managed Care – PPO | Admitting: Primary Care

## 2021-09-27 DIAGNOSIS — F0781 Postconcussional syndrome: Secondary | ICD-10-CM | POA: Diagnosis not present

## 2021-09-27 MED ORDER — KETOROLAC TROMETHAMINE 60 MG/2ML IM SOLN
60.0000 mg | Freq: Once | INTRAMUSCULAR | Status: AC
  Administered 2021-09-27: 16:00:00 60 mg via INTRAMUSCULAR

## 2021-09-27 NOTE — Assessment & Plan Note (Signed)
Neuro exam unremarkable today. Suspect that she attempted to go to work too soon after injury, discussed this today.  Recommended additional brain rest, we will write her out of work for the remainder of this week and for her short week next week.  Recommended she rest, low stimuli activities.  Discussed use of meclizine as needed for dizziness/vertigo symptoms.  Toradol IM 60 mg provided today for headache. Continue gabapentin 600 mg at bedtime as prescribed.  Discussed that she can add an additional 300 mg capsule during the day if needed.  Strict return precautions provided.  She will update if no continued improvement.  MRI brain from October 2021 reviewed.  Hold off on additional imaging now, consider if symptoms progress.
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
## 2021-09-27 NOTE — Patient Instructions (Addendum)
You need to rest as discussed.  You can try Meclizine 12.5 mg every 8 hours as needed for dizziness. This may cause drowsiness.   Please notify me if your symptoms do not improve and/or progress.  It was a pleasure to see you today!

## 2021-09-27 NOTE — Addendum Note (Signed)
Addended by: Donnamarie Poag on: 09/27/2021 04:04 PM   Modules accepted: Orders

## 2021-10-02 ENCOUNTER — Other Ambulatory Visit: Payer: Self-pay | Admitting: Primary Care

## 2021-10-02 DIAGNOSIS — J329 Chronic sinusitis, unspecified: Secondary | ICD-10-CM

## 2021-10-15 DIAGNOSIS — R519 Headache, unspecified: Secondary | ICD-10-CM

## 2021-10-15 DIAGNOSIS — F0781 Postconcussional syndrome: Secondary | ICD-10-CM

## 2021-10-15 DIAGNOSIS — G43709 Chronic migraine without aura, not intractable, without status migrainosus: Secondary | ICD-10-CM

## 2021-10-17 MED ORDER — TOPIRAMATE 50 MG PO TABS
50.0000 mg | ORAL_TABLET | Freq: Every day | ORAL | 0 refills | Status: DC
Start: 2021-10-17 — End: 2021-11-15

## 2021-10-22 ENCOUNTER — Ambulatory Visit
Admission: RE | Admit: 2021-10-22 | Discharge: 2021-10-22 | Disposition: A | Discharge status: Home / Self Care | Payer: BC Managed Care – PPO | Source: Ambulatory Visit | Attending: Primary Care | Admitting: Primary Care

## 2021-10-22 ENCOUNTER — Other Ambulatory Visit: Payer: Self-pay

## 2021-10-22 DIAGNOSIS — F0781 Postconcussional syndrome: Secondary | ICD-10-CM | POA: Diagnosis present

## 2021-10-22 DIAGNOSIS — R519 Headache, unspecified: Secondary | ICD-10-CM | POA: Insufficient documentation

## 2021-10-22 NOTE — Telephone Encounter (Signed)
See patient message regarding concerns of going back to work

## 2021-10-24 ENCOUNTER — Encounter: Payer: Self-pay | Admitting: Primary Care

## 2021-10-24 ENCOUNTER — Other Ambulatory Visit: Payer: Self-pay

## 2021-10-24 ENCOUNTER — Ambulatory Visit: Payer: BC Managed Care – PPO | Admitting: Primary Care

## 2021-10-24 VITALS — BP 120/88 | HR 92 | Temp 97.8°F | Ht 68.0 in | Wt 315.0 lb

## 2021-10-24 DIAGNOSIS — F411 Generalized anxiety disorder: Secondary | ICD-10-CM

## 2021-10-24 DIAGNOSIS — R002 Palpitations: Secondary | ICD-10-CM | POA: Insufficient documentation

## 2021-10-24 DIAGNOSIS — F0781 Postconcussional syndrome: Secondary | ICD-10-CM

## 2021-10-24 DIAGNOSIS — E559 Vitamin D deficiency, unspecified: Secondary | ICD-10-CM

## 2021-10-24 DIAGNOSIS — G43701 Chronic migraine without aura, not intractable, with status migrainosus: Secondary | ICD-10-CM | POA: Diagnosis not present

## 2021-10-24 LAB — CBC
HCT: 37.4 % (ref 36.0–46.0)
Hemoglobin: 11.9 g/dL — ABNORMAL LOW (ref 12.0–15.0)
MCHC: 31.7 g/dL (ref 30.0–36.0)
MCV: 77.4 fl — ABNORMAL LOW (ref 78.0–100.0)
Platelets: 355 10*3/uL (ref 150.0–400.0)
RBC: 4.83 Mil/uL (ref 3.87–5.11)
RDW: 15.8 % — ABNORMAL HIGH (ref 11.5–15.5)
WBC: 10.8 10*3/uL — ABNORMAL HIGH (ref 4.0–10.5)

## 2021-10-24 LAB — TSH: TSH: 0.8 u[IU]/mL (ref 0.35–5.50)

## 2021-10-24 LAB — VITAMIN D 25 HYDROXY (VIT D DEFICIENCY, FRACTURES): VITD: 13.71 ng/mL — ABNORMAL LOW (ref 30.00–100.00)

## 2021-10-24 LAB — T4, FREE: Free T4: 0.84 ng/dL (ref 0.60–1.60)

## 2021-10-24 MED ORDER — GABAPENTIN 300 MG PO CAPS: 900.0000 mg | ORAL_CAPSULE | Freq: Every day | ORAL | 1 refills | Status: AC

## 2021-10-24 NOTE — Assessment & Plan Note (Signed)
Continue gabapentin 900 mg HS. She will start Topamax 50 mg HS tonight.  MRI from 2021 and CT head from 2022 reviewed.  She will update.
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
## 2021-10-24 NOTE — Assessment & Plan Note (Addendum)
Suspect secondary to anxiety, but will rule out cardiac and metabolic cause.   Labs pending for TSH, Free T4, CBC.  ECG today with NSR with rate of 77.  No PAC/PVC, acute ST changes. Appears similar to ECG from 2021.  Continue sertraline 125 mg daily as this was helpful. Continue gabapentin 900 mg HS for sleep and anxiety.   Await results.

## 2021-10-24 NOTE — Patient Instructions (Signed)
Continue gabapentin 900 mg at bedtime for migraines and rest.  Continue sertraline 125 mg daily for anxiety.   Stop by the lab prior to leaving today. I will notify you of your results once received.   You will be contacted regarding your referral to therapy.  Please let us know if you have not been contacted within two weeks.   Please update me regarding the headaches.  It was a pleasure to see you today!
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
## 2021-10-24 NOTE — Assessment & Plan Note (Signed)
Negative CT head from earlier this week. Today her neuro exam is negative, discussed results.   Will work to gain better control over migraines.

## 2021-10-25 ENCOUNTER — Other Ambulatory Visit: Payer: Self-pay | Admitting: Primary Care

## 2021-10-25 DIAGNOSIS — E559 Vitamin D deficiency, unspecified: Secondary | ICD-10-CM

## 2021-10-26 ENCOUNTER — Other Ambulatory Visit (INDEPENDENT_AMBULATORY_CARE_PROVIDER_SITE_OTHER): Payer: BC Managed Care – PPO

## 2021-10-26 ENCOUNTER — Other Ambulatory Visit: Payer: Self-pay

## 2021-10-26 DIAGNOSIS — E559 Vitamin D deficiency, unspecified: Secondary | ICD-10-CM | POA: Diagnosis not present

## 2021-10-26 LAB — BASIC METABOLIC PANEL
BUN: 10 mg/dL (ref 6–23)
CO2: 26 mEq/L (ref 19–32)
Calcium: 8.9 mg/dL (ref 8.4–10.5)
Chloride: 105 mEq/L (ref 96–112)
Creatinine, Ser: 0.67 mg/dL (ref 0.40–1.20)
GFR: 113.39 mL/min (ref >60.00)
Glucose, Bld: 102 mg/dL — ABNORMAL HIGH (ref 70–99)
Potassium: 4.1 mEq/L (ref 3.5–5.1)
Sodium: 139 mEq/L (ref 135–145)

## 2021-10-26 LAB — MAGNESIUM: Magnesium: 1.9 mg/dL (ref 1.5–2.5)

## 2021-10-26 LAB — PHOSPHORUS: Phosphorus: 2.5 mg/dL (ref 2.3–4.6)

## 2021-10-26 LAB — LIPASE: Lipase: 14 U/L (ref 11.0–59.0)

## 2021-10-27 LAB — PARATHYROID HORMONE, INTACT (NO CA): PTH: 62 pg/mL (ref 16–77)

## 2021-10-29 ENCOUNTER — Ambulatory Visit: Payer: BC Managed Care – PPO | Admitting: Family Medicine

## 2021-11-01 ENCOUNTER — Other Ambulatory Visit: Payer: Self-pay | Admitting: Primary Care

## 2021-11-01 DIAGNOSIS — E559 Vitamin D deficiency, unspecified: Secondary | ICD-10-CM

## 2021-11-01 MED ORDER — VITAMIN D3 1.25 MG (50000 UT) PO CAPS: 1.0000 | ORAL_CAPSULE | ORAL | 0 refills | Status: DC

## 2021-11-14 ENCOUNTER — Other Ambulatory Visit: Payer: Self-pay | Admitting: Primary Care

## 2021-11-14 DIAGNOSIS — G43709 Chronic migraine without aura, not intractable, without status migrainosus: Secondary | ICD-10-CM

## 2021-11-14 DIAGNOSIS — R519 Headache, unspecified: Secondary | ICD-10-CM

## 2021-11-16 ENCOUNTER — Other Ambulatory Visit: Payer: Self-pay

## 2021-11-16 ENCOUNTER — Encounter: Payer: Self-pay | Admitting: Primary Care

## 2021-11-16 ENCOUNTER — Ambulatory Visit: Payer: BC Managed Care – PPO | Admitting: Primary Care

## 2021-11-16 ENCOUNTER — Ambulatory Visit (INDEPENDENT_AMBULATORY_CARE_PROVIDER_SITE_OTHER)
Admission: RE | Admit: 2021-11-16 | Discharge: 2021-11-16 | Disposition: A | Discharge status: Home / Self Care | Payer: BC Managed Care – PPO | Source: Ambulatory Visit | Attending: Primary Care | Admitting: Primary Care

## 2021-11-16 VITALS — BP 120/82 | HR 102 | Temp 97.6°F | Ht 68.0 in | Wt 321.0 lb

## 2021-11-16 DIAGNOSIS — Z23 Encounter for immunization: Secondary | ICD-10-CM

## 2021-11-16 DIAGNOSIS — E559 Vitamin D deficiency, unspecified: Secondary | ICD-10-CM | POA: Diagnosis not present

## 2021-11-16 DIAGNOSIS — G43709 Chronic migraine without aura, not intractable, without status migrainosus: Secondary | ICD-10-CM | POA: Diagnosis not present

## 2021-11-16 DIAGNOSIS — Z111 Encounter for screening for respiratory tuberculosis: Secondary | ICD-10-CM

## 2021-11-16 MED ORDER — WEGOVY 0.25 MG/0.5ML ~~LOC~~ SOAJ: 0.2500 mg | SUBCUTANEOUS | 0 refills | Status: DC

## 2021-11-16 NOTE — Patient Instructions (Signed)
Complete xray(s) prior to leaving today. I will notify you of your results once received.  Start semaglutide [facility]) 0.25 mg once weekly. Inject 0.25 mg once weekly into the skin. Message me when you use your last pen so we can increase the dose to 0.5 mg.   Set up a follow up visit for 2 months after you start the [facility].  It was a pleasure to see you today!

## 2021-11-16 NOTE — Assessment & Plan Note (Signed)
Continues to remain low despite Rx treatment.  She is now on week 3 of vitamin D3 50,000 IU weekly. Continue same.   Repeat vitamin D at next visit.

## 2021-11-16 NOTE — Assessment & Plan Note (Signed)
Improved on Topamax 50 mg, continue same. Do suspect her current occupation to be contributing to migraines.   She will update as she starts her new role in March 2023.

## 2021-11-16 NOTE — Progress Notes (Signed)
Subjective:    Patient ID: Gloria Dyer, female    DOB: 14-May-1986, 36 y.o.   MRN: [id number]  HPI  Gloria Dyer is a very pleasant 36 y.o. female with a history of migraines, GERD, post concussion syndrome, hyperlipidemia, GAD, morbid obesity who presents today to discuss weight loss treatment, follow of headaches, and form completion.  She has a form today for a new role at work. 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 She begins March 20th, 2023. 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   Progressive weight gain over the years. Weight of 273 lbs in December 2017, up and down for a few years, increased weight in May 2021 to 289 lbs, 304 lbs in September 2021, 316 lbs in November 2022, 321 lbs today.  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 She did get a new job, will be working in an entirely different role, is excited about this.   Headaches have significantly improved since initiation of Topamax 50 mg and also when being at home over Christmas break.   History of obesity since teenage years, family history of obesity in mother, history of anorexia nervosa in her sister.   She's tried Weight Watchers in high school, did well for a few years, began to regain weight in college. Weight has been fluctuating since college, worse over last few years.   She's also tried calorie counting, Optivia, Nutrisystem with temporary improvement.   Diet currently consists of:  Breakfast: Skip mostly Lunch: Skips some, pasta mostly Dinner: pasta, fast food, take out food Snacks: Occasionally  Desserts: Occasionally  Beverages: Sweet tea, little water, diet soda  Exercise: None   BP Readings from Last 3 Encounters:  11/16/21 120/82  10/24/21 120/88  09/27/21 136/80   Wt Readings  from Last 3 Encounters:  11/16/21 (!) 321 lb (145.6 kg)  10/24/21 (!) 315 lb (142.9 kg)  09/27/21 (!) 316 lb (143.3 kg)      Review of Systems  Respiratory:  Negative for shortness of breath.   Cardiovascular:  Negative for chest pain.  Neurological:  Negative for dizziness and headaches.        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   No children.   Works as a Pharmacist, hospital.   Currently in graduate school.   Social Determinants of Health   Financial Resource Strain: Not on file  Food Insecurity: Not on file  Transportation Needs:  Not on file  Physical Activity: Not on file  Stress: Not on file  Social Connections: Not on file  Intimate Partner Violence: Not on file    Past Surgical History:  Procedure Laterality Date   ENDOMETRIAL BIOPSY     TONSILLECTOMY AND ADENOIDECTOMY      Family History  Problem Relation Age of Onset   Breast cancer Mother 31       stage 3C   Asthma Mother    Thyroid disease Mother    Berenice Primas' disease Mother    Hyperlipidemia Father    Stroke Father    Hypertension Father    Macular degeneration Father    Diabetes Paternal Grandmother    Hyperlipidemia Paternal Grandmother    Glaucoma Paternal Grandmother    Alzheimer's disease Paternal Grandmother     Diabetes Paternal Grandfather    Hyperlipidemia Paternal Grandfather    Heart disease Paternal Grandfather    Hypertension Paternal Grandfather    Kidney disease Paternal Grandfather    Macular degeneration Paternal Grandfather    Eating disorder Sister    Alzheimer's disease Maternal Grandfather    Breast cancer Maternal Aunt    Diabetes Paternal Aunt     No Known Allergies  Current Outpatient Medications on File Prior to Visit  Medication Sig Dispense Refill   Cholecalciferol (VITAMIN D3) 1.25 MG (50000 UT) CAPS Take 1 capsule by mouth once a week. For vitamin D. 12 capsule 0   cyanocobalamin 100 MCG tablet Take 100 mcg by mouth daily.     cyclobenzaprine (FLEXERIL) 5 MG tablet Take 1 tablet by mouth daily as needed at migraine onset. 30 tablet 0   fluticasone (FLONASE) 50 MCG/ACT nasal spray PLACE 1 SPRAY INTO BOTH NOSTRILS 2 (TWO) TIMES DAILY. 16 g 0   gabapentin (NEURONTIN) 300 MG capsule Take 3 capsules (900 mg total) by mouth at bedtime. For migraine prevention. 270 capsule 1   Melatonin 3 MG TABS Take 3 mg by mouth.     montelukast (SINGULAIR) 10 MG tablet TAKE 1 TABLET BY MOUTH EVERYDAY AT BEDTIME FOR ALLERGIES. 90 tablet 1   Norethindrone Acetate-Ethinyl Estradiol (JUNEL 1.5/30) 1.5-30 MG-MCG tablet Take 1 tablet by mouth daily. 84 tablet 0   pantoprazole (PROTONIX) 20 MG tablet Take 1 tablet (20 mg total) by mouth daily. For Reflux 90 tablet 3   Probiotic Product (ALIGN PO) Take 1 tablet by mouth.     sertraline (ZOLOFT) 100 MG tablet TAKE 1 TABLET (100 MG TOTAL) BY MOUTH DAILY. FOR ANXIETY. 90 tablet 2   sertraline (ZOLOFT) 25 MG tablet Take 1 tablet (25 mg total) by mouth daily. For anxiety. Take with 100 mg tablet. 90 tablet 0   topiramate (TOPAMAX) 50 MG tablet TAKE 1 TABLET (50 MG TOTAL) BY MOUTH AT BEDTIME. FOR HEADACHE PREVENTION 90 tablet 3   No current facility-administered medications on file prior to visit.    BP 120/82    Pulse (!) 102    Temp 97.6 F (36.4 C)  (Temporal)    Ht 5\' 8"  (1.727 m)    Wt (!) 321 lb (145.6 kg)    SpO2 97%    BMI 48.81 kg/m  Objective:   Physical Exam Cardiovascular:     Rate and Rhythm: Normal rate and regular rhythm.  Pulmonary:     Effort: Pulmonary effort is normal.     Breath sounds: Normal breath sounds.  Musculoskeletal:     Cervical back: Neck supple.  Skin:    General: Skin is  warm and dry.          Assessment & Plan:      This visit occurred during the SARS-CoV-2 public health emergency.  Safety protocols were in place, including screening questions prior to the visit, additional usage of staff PPE, and extensive cleaning of exam room while observing appropriate contact time as indicated for disinfecting solutions.

## 2021-11-16 NOTE — Assessment & Plan Note (Addendum)
Poor diet and is not exercising.  Long discussion regarding her diet and the absolute need to improve. Discussed healthy alternatives. Add exercise.  Recent labs reviewed.  Agree to start Wegovy, start with 0.25 mg weekly, then titrate up each month thereafter. Discussed potential side effects. We will see her back for follow up in 6-8 weeks once she receives the prescription.

## 2021-11-20 ENCOUNTER — Encounter: Payer: Self-pay | Admitting: Primary Care

## 2021-11-20 ENCOUNTER — Telehealth (INDEPENDENT_AMBULATORY_CARE_PROVIDER_SITE_OTHER): Payer: BC Managed Care – PPO | Admitting: Primary Care

## 2021-11-20 ENCOUNTER — Other Ambulatory Visit: Payer: Self-pay | Admitting: Family Medicine

## 2021-11-20 VITALS — Ht 68.0 in | Wt 321.0 lb

## 2021-11-20 DIAGNOSIS — R051 Acute cough: Secondary | ICD-10-CM | POA: Insufficient documentation

## 2021-11-20 DIAGNOSIS — J029 Acute pharyngitis, unspecified: Secondary | ICD-10-CM | POA: Diagnosis not present

## 2021-11-20 DIAGNOSIS — Z0000 Encounter for general adult medical examination without abnormal findings: Secondary | ICD-10-CM

## 2021-11-20 LAB — POC INFLUENZA A&B (BINAX/QUICKVUE)
Influenza A, POC: NEGATIVE
Influenza B, POC: NEGATIVE

## 2021-11-20 LAB — POC COVID19 BINAXNOW: SARS Coronavirus 2 Ag: NEGATIVE

## 2021-11-20 MED ORDER — GUAIFENESIN-CODEINE 100-10 MG/5ML PO SYRP: 5.0000 mL | ORAL_SOLUTION | Freq: Three times a day (TID) | ORAL | 0 refills | Status: DC | PRN

## 2021-11-20 NOTE — Assessment & Plan Note (Signed)
Likely viral etiology.  Flu and Covid-19 tests negative. Discussed symptoms and trajectory of viral URI.  Recommended she add Zyrtec and Tylenol. Rx for Cheratussin sent to pharmacy. Drowsiness precautions provided.  She will update regarding the work note.

## 2021-11-20 NOTE — Progress Notes (Signed)
Patient ID: Gloria Dyer, female    DOB: 06/17/1986, 36 y.o.   MRN: [id number]  Virtual visit completed through [employer], a video enabled telemedicine application. Due to national recommendations of social distancing due to COVID-19, a virtual visit is felt to be most appropriate for this patient at this time. Reviewed limitations, risks, security and privacy concerns of performing a virtual visit and the availability of in person appointments. I also reviewed that there may be a patient responsible charge related to this service. The patient agreed to proceed.   Patient location: home Provider location: [facility], office Persons participating in this virtual visit: patient, provider   If any vitals were documented, they were collected by patient at home unless specified below.    Ht 5\' 8"  (1.727 m)    Wt (!) 321 lb (145.6 kg)    BMI 48.81 kg/m    CC:  Subjective:   HPI: Gloria Dyer is a 36 y.o. female with a history of migraines, post concussive syndrome, GAD, seasonal allergies presenting on 11/20/2021 for cough.  Symptom onset yesterday with sore throat, congestion, cough upon waking. Today she endorsees body aches, chills, headaches.   She denies fevers. She took a Covid-19 test yesterday when waking which was negative.   She's taken Ibuprofen with little improvement. Her most bothersome symptom is cough.   Several of her co-workers are out with the same symptoms, one tested positive for Covid-19. She will need a work note, but doesn't know the exact dates yet. She missed work on 01/09 and today.   She went to our 03/09 this morning and tested negative for both influenza and Covid-19.        Relevant past medical, surgical, family and social history reviewed and updated as indicated. Interim medical history since our last visit reviewed. Allergies and medications reviewed and updated. Outpatient Medications Prior to Visit   Medication Sig Dispense Refill   Cholecalciferol (VITAMIN D3) 1.25 MG (50000 UT) CAPS Take 1 capsule by mouth once a week. For vitamin D. 12 capsule 0   cyanocobalamin 100 MCG tablet Take 100 mcg by mouth daily.     cyclobenzaprine (FLEXERIL) 5 MG tablet Take 1 tablet by mouth daily as needed at migraine onset. 30 tablet 0   gabapentin (NEURONTIN) 300 MG capsule Take 3 capsules (900 mg total) by mouth at bedtime. For migraine prevention. 270 capsule 1   Melatonin 3 MG TABS Take 3 mg by mouth.     montelukast (SINGULAIR) 10 MG tablet TAKE 1 TABLET BY MOUTH EVERYDAY AT BEDTIME FOR ALLERGIES. 90 tablet 1   Norethindrone Acetate-Ethinyl Estradiol (JUNEL 1.5/30) 1.5-30 MG-MCG tablet Take 1 tablet by mouth daily. 84 tablet 0   pantoprazole (PROTONIX) 20 MG tablet Take 1 tablet (20 mg total) by mouth daily. For Reflux 90 tablet 3   Probiotic Product (ALIGN PO) Take 1 tablet by mouth.     sertraline (ZOLOFT) 100 MG tablet TAKE 1 TABLET (100 MG TOTAL) BY MOUTH DAILY. FOR ANXIETY. 90 tablet 2   sertraline (ZOLOFT) 25 MG tablet Take 1 tablet (25 mg total) by mouth daily. For anxiety. Take with 100 mg tablet. 90 tablet 0   topiramate (TOPAMAX) 50 MG tablet TAKE 1 TABLET (50 MG TOTAL) BY MOUTH AT BEDTIME. FOR HEADACHE PREVENTION 90 tablet 3   fluticasone (FLONASE) 50 MCG/ACT nasal spray PLACE 1 SPRAY INTO BOTH NOSTRILS 2 (TWO) TIMES DAILY. (Patient not taking: Reported on 11/20/2021) 16 g  0   Semaglutide-Weight Management (WEGOVY) 0.25 MG/0.5ML SOAJ Inject 0.25 mg into the skin once a week. (Patient not taking: Reported on 11/20/2021) 2 mL 0   No facility-administered medications prior to visit.     Per HPI unless specifically indicated in ROS section below Review of Systems  Constitutional:  Positive for chills and fatigue. Negative for fever.  HENT:  Positive for congestion and postnasal drip.   Respiratory:  Positive for cough.   Objective:  Ht 5\' 8"  (1.727 m)    Wt (!) 321 lb (145.6 kg)    BMI 48.81  kg/m   Wt Readings from Last 3 Encounters:  11/20/21 (!) 321 lb (145.6 kg)  11/16/21 (!) 321 lb (145.6 kg)  10/24/21 (!) 315 lb (142.9 kg)       Physical exam: General: Alert and oriented x 3, no distress, does appear sickly  Pulmonary: Speaks in complete sentences without increased work of breathing, deep/dry cough during visit.  Psychiatric: Normal mood, thought content, and behavior.     Results for orders placed or performed in visit on 11/20/21  POC COVID-19  Result Value Ref Range   SARS Coronavirus 2 Ag Negative Negative  POC Influenza A&B(BINAX/QUICKVUE)  Result Value Ref Range   Influenza A, POC Negative Negative   Influenza B, POC Negative Negative   Assessment & Plan:   Problem List Items Addressed This Visit       Other   Acute cough - Primary    Likely viral etiology.  Flu and Covid-19 tests negative. Discussed symptoms and trajectory of viral URI.  Recommended she add Zyrtec and Tylenol. Rx for Cheratussin sent to pharmacy. Drowsiness precautions provided.  She will update regarding the work note.      Relevant Medications   guaiFENesin-codeine (ROBITUSSIN AC) 100-10 MG/5ML syrup   Other Relevant Orders   POC COVID-19 (Completed)   POC Influenza A&B(BINAX/QUICKVUE) (Completed)   Other Visit Diagnoses     Sore throat       Relevant Orders   POC COVID-19 (Completed)   POC Influenza A&B(BINAX/QUICKVUE) (Completed)        Meds ordered this encounter  Medications   guaiFENesin-codeine (ROBITUSSIN AC) 100-10 MG/5ML syrup    Sig: Take 5 mLs by mouth 3 (three) times daily as needed for cough.    Dispense:  75 mL    Refill:  0    Order Specific Question:   Supervising Provider    Answer:   Ermalene SearingBEDSOLE, AMY E [[id number]]   Orders Placed This Encounter  Procedures   POC COVID-19    Order Specific Question:   Previously tested for COVID-19    Answer:   No    Order Specific Question:   Resident in a congregate (group) care setting    Answer:   No     Order Specific Question:   Employed in healthcare setting    Answer:   Unknown    Order Specific Question:   Pregnant    Answer:   Unknown   POC Influenza A&B(BINAX/QUICKVUE)    I discussed the assessment and treatment plan with the patient. The patient was provided an opportunity to ask questions and all were answered. The patient agreed with the plan and demonstrated an understanding of the instructions. The patient was advised to call back or seek an in-person evaluation if the symptoms worsen or if the condition fails to improve as anticipated.  Follow up plan:  You may take the cough suppressant every 8 hours  as needed for cough and rest. Caution this medication contains codeine which may cause drowsiness.   You can add Zyrtec daily for the drainage. Continue Ibuprofen. Consider adding Tylenol.  It was a pleasure to see you today!   Doreene Nest, NP

## 2021-11-20 NOTE — Patient Instructions (Signed)
You may take the cough suppressant every 8 hours as needed for cough and rest. Caution this medication contains codeine which may cause drowsiness.   You can add Zyrtec daily for the drainage. Continue Ibuprofen. Consider adding Tylenol.  It was a pleasure to see you today!

## 2021-11-20 NOTE — Telephone Encounter (Signed)
Gloria Dyer, she needs flu and Covid testing, rapid, at [facility] today before her appointment with me at 3:20 pm.

## 2021-11-21 ENCOUNTER — Ambulatory Visit: Payer: BC Managed Care – PPO | Admitting: Psychology

## 2021-11-21 ENCOUNTER — Telehealth: Payer: BC Managed Care – PPO | Admitting: Primary Care

## 2021-11-21 NOTE — Telephone Encounter (Signed)
Patient was seen and test ran yesterday. No further action needed.

## 2021-11-21 NOTE — Telephone Encounter (Signed)
[facility], any updates?

## 2021-11-22 ENCOUNTER — Other Ambulatory Visit: Payer: Self-pay

## 2021-11-22 DIAGNOSIS — Z0000 Encounter for general adult medical examination without abnormal findings: Secondary | ICD-10-CM

## 2021-11-22 NOTE — Addendum Note (Signed)
Addended by: Donnamarie Poag on: 11/22/2021 09:56 AM   Modules accepted: Orders

## 2021-11-26 ENCOUNTER — Ambulatory Visit (INDEPENDENT_AMBULATORY_CARE_PROVIDER_SITE_OTHER): Payer: BC Managed Care – PPO | Admitting: Family Medicine

## 2021-11-26 ENCOUNTER — Encounter: Payer: Self-pay | Admitting: Family Medicine

## 2021-11-26 ENCOUNTER — Telehealth: Payer: Self-pay

## 2021-11-26 ENCOUNTER — Other Ambulatory Visit: Payer: Self-pay

## 2021-11-26 DIAGNOSIS — J0100 Acute maxillary sinusitis, unspecified: Secondary | ICD-10-CM

## 2021-11-26 DIAGNOSIS — R051 Acute cough: Secondary | ICD-10-CM | POA: Diagnosis not present

## 2021-11-26 DIAGNOSIS — J019 Acute sinusitis, unspecified: Secondary | ICD-10-CM | POA: Insufficient documentation

## 2021-11-26 MED ORDER — PREDNISONE 10 MG PO TABS: ORAL_TABLET | ORAL | 0 refills | Status: DC

## 2021-11-26 MED ORDER — AMOXICILLIN-POT CLAVULANATE 875-125 MG PO TABS: 1.0000 | ORAL_TABLET | Freq: Two times a day (BID) | ORAL | 0 refills | Status: DC

## 2021-11-26 MED ORDER — GUAIFENESIN-CODEINE 100-10 MG/5ML PO SYRP: 5.0000 mL | ORAL_SOLUTION | Freq: Three times a day (TID) | ORAL | 0 refills | Status: DC | PRN

## 2021-11-26 NOTE — Assessment & Plan Note (Addendum)
S/p viral uri (neg covid and flu testing) with sinus pressure/intermittent low grade temp /cough and wheezing  Discussed symptom care Robitussin AC px  Tylenol prn  Px augmentin and prednisone 30 mg taper (reviewed side eff)  Update if not starting to improve in a week or if worsening   Watch for sob/worse chest wall pain  ER precautions reviewed  Meds ordered this encounter  Medications   amoxicillin-clavulanate (AUGMENTIN) 875-125 MG tablet    Sig: Take 1 tablet by mouth 2 (two) times daily.    Dispense:  14 tablet    Refill:  0   predniSONE (DELTASONE) 10 MG tablet    Sig: Take 3 pills once daily by mouth for 3 days, then 2 pills once daily for 3 days, then 1 pill once daily for 3 days and then stop    Dispense:  18 tablet    Refill:  0   guaiFENesin-codeine (ROBITUSSIN AC) 100-10 MG/5ML syrup    Sig: Take 5 mLs by mouth 3 (three) times daily as needed for cough. Caution of sedation    Dispense:  75 mL    Refill:  0

## 2021-11-26 NOTE — Patient Instructions (Addendum)
Ibuprofen is every 6-8 hours (for the future) While on prednisone hold it    Take augmentin for sinus infection  Prednisone-for congestion /wheezing and breathing (it can make you feel hyper and hungry)   Drink lots of fluids Robitussin AC (px) for cough 2 tylenol up to every 4 hours for pain (the prednisone will help that also)  Gentle heat to painful areas and to face   If symptoms sudden worsen and/or short of breath -go to the ER   Update if not starting to improve in a week or if worsening

## 2021-11-26 NOTE — Progress Notes (Signed)
Subjective:    Patient ID: Gloria Dyer, female    DOB: November 27, 1985, 36 y.o.   MRN: [id number]  This visit occurred during the SARS-CoV-2 public health emergency.  Safety protocols were in place, including screening questions prior to the visit, additional usage of staff PPE, and extensive cleaning of exam room while observing appropriate contact time as indicated for disinfecting solutions.   HPI 36 yo pt of NP Clark presents for cough   Wt Readings from Last 3 Encounters:  11/26/21 (!) 320 lb (145.2 kg)  11/20/21 (!) 321 lb (145.6 kg)  11/16/21 (!) 321 lb (145.6 kg)   48.66 kg/m   She was seen on 1/10 by pcp for viral uri   (notes and results reviewed today) Her flu and covid tests were negative  She was px robitussin ac for symptom control  Pt called on 1/14 and noted she had temp of 100.4 the night before  (none since then)  Coughing that hurts her chest and back - occ prod/unsure if mucous has color  Headache and pain in teeth  Lots of nasal mucous/? Color Sinuses hurt under eyes  Constant frontal headache  Can hear a rattle -at night when lying down  ? Wheeze -is expiratory -sometimes a squeak   Ears do not hurt  Throat is sore   Taking mucinex DM and ibuprofen and missed a week of work, has acetaminophen  Ibuprofen 800 mg every 4 hours  Nasal spray- ? What type   No n/v/d   Patient Active Problem List   Diagnosis Date Noted   Acute sinusitis 11/26/2021   Acute cough 11/20/2021   Class 3 obesity ([place]) 11/16/2021   Vitamin D deficiency 10/24/2021   Palpitations 10/24/2021   Post concussion syndrome 09/27/2021   Vitamin B 12 deficiency 01/11/2021   Obesity 10/19/2020   Syncope 03/31/2020   Leg cramping 03/31/2020   Lower extremity numbness 03/31/2020   Environmental and seasonal allergies 03/23/2020   Ankle edema 03/23/2020   Tingling of both feet 03/23/2020   Family history of breast cancer 01/05/2020   GAD (generalized anxiety disorder) 05/24/2019    Endometriosis 01/14/2018   Preventative health care 11/19/2017   Recurrent sinusitis 11/01/2016   Gastroesophageal reflux disease 11/01/2016   Hyperlipidemia 11/01/2016   Migraines 05/14/2010   Past Medical History:  Diagnosis Date   Endometriosis    GERD (gastroesophageal reflux disease)    Hyperlipidemia    IBS (irritable bowel syndrome)    Migraines    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   Past Surgical History:  Procedure Laterality Date   ENDOMETRIAL BIOPSY     TONSILLECTOMY AND ADENOIDECTOMY     Social History   Tobacco Use   Smoking status: Never   Smokeless tobacco: Never  Substance Use Topics   Alcohol use: No   Drug use: No   Family History  Problem Relation Age of Onset   Breast cancer Mother 3       stage 3C   Asthma Mother    Thyroid disease Mother    Berenice Primas' disease Mother    Hyperlipidemia Father    Stroke Father    Hypertension Father    Macular degeneration Father    Diabetes Paternal Grandmother    Hyperlipidemia Paternal Grandmother    Glaucoma Paternal Grandmother    Alzheimer's disease Paternal Grandmother    Diabetes Paternal Grandfather    Hyperlipidemia Paternal Grandfather    Heart disease Paternal Grandfather  Hypertension Paternal Grandfather    Kidney disease Paternal Grandfather    Macular degeneration Paternal Grandfather    Eating disorder Sister    Alzheimer's disease Maternal Grandfather    Breast cancer Maternal Aunt    Diabetes Paternal Aunt    No Known Allergies Current Outpatient Medications on File Prior to Visit  Medication Sig Dispense Refill   Cholecalciferol (VITAMIN D3) 1.25 MG (50000 UT) CAPS Take 1 capsule by mouth once a week. For vitamin D. 12 capsule 0   cyanocobalamin 100 MCG tablet Take 100 mcg by mouth daily.     cyclobenzaprine (FLEXERIL) 5 MG tablet Take 1 tablet by mouth daily as needed at migraine onset. 30 tablet 0   gabapentin (NEURONTIN) 300 MG capsule Take 3 capsules (900 mg total) by mouth at  bedtime. For migraine prevention. 270 capsule 1   JUNEL 1.5/30 1.5-30 MG-MCG tablet TAKE 1 TABLET BY MOUTH EVERY DAY 84 tablet 0   Melatonin 3 MG TABS Take 3 mg by mouth.     montelukast (SINGULAIR) 10 MG tablet TAKE 1 TABLET BY MOUTH EVERYDAY AT BEDTIME FOR ALLERGIES. 90 tablet 1   pantoprazole (PROTONIX) 20 MG tablet Take 1 tablet (20 mg total) by mouth daily. For Reflux 90 tablet 3   Probiotic Product (ALIGN PO) Take 1 tablet by mouth.     sertraline (ZOLOFT) 100 MG tablet TAKE 1 TABLET (100 MG TOTAL) BY MOUTH DAILY. FOR ANXIETY. 90 tablet 2   sertraline (ZOLOFT) 25 MG tablet Take 1 tablet (25 mg total) by mouth daily. For anxiety. Take with 100 mg tablet. 90 tablet 0   topiramate (TOPAMAX) 50 MG tablet TAKE 1 TABLET (50 MG TOTAL) BY MOUTH AT BEDTIME. FOR HEADACHE PREVENTION 90 tablet 3   fluticasone (FLONASE) 50 MCG/ACT nasal spray PLACE 1 SPRAY INTO BOTH NOSTRILS 2 (TWO) TIMES DAILY. (Patient not taking: Reported on 11/26/2021) 16 g 0   Semaglutide-Weight Management (WEGOVY) 0.25 MG/0.5ML SOAJ Inject 0.25 mg into the skin once a week. (Patient not taking: Reported on 11/26/2021) 2 mL 0   No current facility-administered medications on file prior to visit.    Review of Systems  Constitutional:  Positive for fatigue. Negative for activity change, appetite change, fever and unexpected weight change.  HENT:  Positive for postnasal drip, sinus pressure and sore throat. Negative for congestion, ear pain and rhinorrhea.   Eyes:  Negative for pain, redness and visual disturbance.  Respiratory:  Positive for cough and wheezing. Negative for shortness of breath.   Cardiovascular:  Negative for chest pain and palpitations.  Gastrointestinal:  Negative for abdominal pain, blood in stool, constipation and diarrhea.  Endocrine: Negative for polydipsia and polyuria.  Genitourinary:  Negative for dysuria, frequency and urgency.  Musculoskeletal:  Negative for arthralgias, back pain and myalgias.  Skin:   Negative for pallor and rash.  Allergic/Immunologic: Negative for environmental allergies.  Neurological:  Positive for headaches. Negative for dizziness and syncope.  Hematological:  Negative for adenopathy. Does not bruise/bleed easily.  Psychiatric/Behavioral:  Negative for decreased concentration and dysphoric mood. The patient is not nervous/anxious.       Objective:   Physical Exam Constitutional:      General: She is not in acute distress.    Appearance: Normal appearance. She is well-developed. She is obese. She is not ill-appearing.  HENT:     Head: Normocephalic and atraumatic.     Comments: Bilateral maxillary and frontal sinus tenderness    Right Ear: Tympanic membrane and external ear  normal.     Left Ear: Tympanic membrane and external ear normal.     Nose: Congestion and rhinorrhea present.     Mouth/Throat:     Pharynx: Oropharynx is clear. No oropharyngeal exudate or posterior oropharyngeal erythema.     Comments: Clear pnd Eyes:     General:        Right eye: No discharge.        Left eye: No discharge.     Conjunctiva/sclera: Conjunctivae normal.     Pupils: Pupils are equal, round, and reactive to light.  Cardiovascular:     Rate and Rhythm: Normal rate and regular rhythm.  Pulmonary:     Effort: Pulmonary effort is normal. No respiratory distress.     Breath sounds: Normal breath sounds. No stridor. No wheezing, rhonchi or rales.     Comments: Good air exch No rales or rhonchi  Some lateral chest wall tenderness bilaterally Chest:     Chest wall: Tenderness present.  Musculoskeletal:     Cervical back: Normal range of motion and neck supple.  Lymphadenopathy:     Cervical: No cervical adenopathy.  Skin:    General: Skin is warm and dry.     Findings: No rash.  Neurological:     Mental Status: She is alert.     Cranial Nerves: No cranial nerve deficit.     Coordination: Coordination normal.  Psychiatric:        Mood and Affect: Mood normal.           Assessment & Plan:   Problem List Items Addressed This Visit       Respiratory   Acute sinusitis    S/p viral uri (neg covid and flu testing) with sinus pressure/intermittent low grade temp /cough and wheezing  Discussed symptom care Robitussin AC px  Tylenol prn  Px augmentin and prednisone 30 mg taper (reviewed side eff)  Update if not starting to improve in a week or if worsening   Watch for sob/worse chest wall pain  ER precautions reviewed  Meds ordered this encounter  Medications   amoxicillin-clavulanate (AUGMENTIN) 875-125 MG tablet    Sig: Take 1 tablet by mouth 2 (two) times daily.    Dispense:  14 tablet    Refill:  0   predniSONE (DELTASONE) 10 MG tablet    Sig: Take 3 pills once daily by mouth for 3 days, then 2 pills once daily for 3 days, then 1 pill once daily for 3 days and then stop    Dispense:  18 tablet    Refill:  0   guaiFENesin-codeine (ROBITUSSIN AC) 100-10 MG/5ML syrup    Sig: Take 5 mLs by mouth 3 (three) times daily as needed for cough. Caution of sedation    Dispense:  75 mL    Refill:  0         Relevant Medications   amoxicillin-clavulanate (AUGMENTIN) 875-125 MG tablet   predniSONE (DELTASONE) 10 MG tablet   guaiFENesin-codeine (ROBITUSSIN AC) 100-10 MG/5ML syrup     Other   Acute cough   Relevant Medications   guaiFENesin-codeine (ROBITUSSIN AC) 100-10 MG/5ML syrup

## 2021-11-26 NOTE — Telephone Encounter (Signed)
Fax received with authorization for [facility] Effective 11/22/21 to 06/22/2022. I have sent fax to scan and sent my chart to patient to let know received.

## 2021-11-27 MED ORDER — GUAIFENESIN-CODEINE 100-10 MG/5ML PO SYRP: 5.0000 mL | ORAL_SOLUTION | Freq: Three times a day (TID) | ORAL | 0 refills | Status: DC | PRN

## 2021-11-29 ENCOUNTER — Other Ambulatory Visit: Payer: Self-pay | Admitting: Primary Care

## 2021-11-29 DIAGNOSIS — F411 Generalized anxiety disorder: Secondary | ICD-10-CM

## 2021-12-03 ENCOUNTER — Ambulatory Visit (INDEPENDENT_AMBULATORY_CARE_PROVIDER_SITE_OTHER): Payer: BC Managed Care – PPO | Admitting: Family Medicine

## 2021-12-03 ENCOUNTER — Encounter: Payer: Self-pay | Admitting: Family Medicine

## 2021-12-03 ENCOUNTER — Other Ambulatory Visit: Payer: Self-pay

## 2021-12-03 VITALS — BP 143/82 | HR 112 | Ht 67.0 in | Wt 323.0 lb

## 2021-12-03 DIAGNOSIS — Z803 Family history of malignant neoplasm of breast: Secondary | ICD-10-CM

## 2021-12-03 DIAGNOSIS — Z01419 Encounter for gynecological examination (general) (routine) without abnormal findings: Secondary | ICD-10-CM | POA: Diagnosis not present

## 2021-12-03 DIAGNOSIS — Z0000 Encounter for general adult medical examination without abnormal findings: Secondary | ICD-10-CM

## 2021-12-03 MED ORDER — NORETHINDRONE ACET-ETHINYL EST 1.5-30 MG-MCG PO TABS: 1.0000 | ORAL_TABLET | Freq: Every day | ORAL | 3 refills | Status: AC

## 2021-12-03 NOTE — Progress Notes (Signed)
Needs refill on ocp's

## 2021-12-03 NOTE — Progress Notes (Signed)
° °  GYNECOLOGY ANNUAL PREVENTATIVE CARE ENCOUNTER NOTE  Subjective:   Gloria Dyer is a 36 y.o. G0P0000 female here for a routine annual gynecologic exam.  Current complaints: none.   Denies abnormal vaginal bleeding, discharge, pelvic pain, problems with intercourse or other gynecologic concerns.    Gynecologic History No LMP recorded. (Menstrual status: Oral contraceptives). Contraception: OCP (estrogen/progesterone)- uses for cycle control, not sexually active Last Pap: 2021 Results were: normal Last mammogram: at 58-- mother with h/o breast cancer in late 35s. Results were: normal  Health Maintenance Due  Topic Date Due   Hepatitis C Screening  Never done   COVID-19 Vaccine (4 - Booster for Pfizer series) 11/25/2020    The following portions of the patient's history were reviewed and updated as appropriate: allergies, current medications, past family history, past medical history, past social history, past surgical history and problem list.  Review of Systems Pertinent items are noted in HPI.   Objective:  BP (!) 143/82    Pulse (!) 112    Ht 5\' 7"  (1.702 m)    Wt (!) 323 lb (146.5 kg)    BMI 50.59 kg/m  CONSTITUTIONAL: Well-developed, well-nourished female in no acute distress.  HENT:  Normocephalic, atraumatic, External right and left ear normal. Oropharynx is clear and moist EYES:  No scleral icterus.  NECK: Normal range of motion, supple, no masses.  Normal thyroid.  SKIN: Skin is warm and dry. No rash noted. Not diaphoretic. No erythema. No pallor. NEUROLOGIC: Alert and oriented to person, place, and time. Normal reflexes, muscle tone coordination. No cranial nerve deficit noted. PSYCHIATRIC: Normal mood and affect. Normal behavior. Normal judgment and thought content. CARDIOVASCULAR: Normal heart rate noted, regular rhythm. 2+ distal pulses. RESPIRATORY: Effort and breath sounds normal, no problems with respiration noted. BREASTS: Symmetric in size. No masses, skin  changes, nipple drainage, or lymphadenopathy. ABDOMEN: Soft,  no distention noted.  No tenderness, rebound or guarding.  PELVIC: deferred MUSCULOSKELETAL: Normal range of motion.    Assessment and Plan:  1) Annual gynecologic examination.  Routine preventative health maintenance measures emphasized.  1. Family history of breast cancer - MM 3D SCREEN BREAST BILATERAL; Future  2. Well woman exam (no gynecological exam) -CBE WNL -Reviewed next pap in 5 years -Discussed weight loss and blood pressure- she will follow with PCP - Norethindrone Acetate-Ethinyl Estradiol (JUNEL 1.5/30) 1.5-30 MG-MCG tablet; Take 1 tablet by mouth daily.  Dispense: 84 tablet; Refill: 3   Please refer to After Visit Summary for other counseling recommendations.   Return in about 1 year (around 12/03/2022) for Yearly wellness exam.  12/05/2022, MD, MPH, ABFM Attending [facility]

## 2021-12-06 NOTE — Telephone Encounter (Signed)
Called patient was not able to reach to check on her. Do see where she has appointment tomorrow.

## 2021-12-07 ENCOUNTER — Encounter: Payer: Self-pay | Admitting: Primary Care

## 2021-12-07 ENCOUNTER — Ambulatory Visit: Payer: BC Managed Care – PPO | Admitting: Primary Care

## 2021-12-07 ENCOUNTER — Other Ambulatory Visit: Payer: Self-pay

## 2021-12-07 DIAGNOSIS — F411 Generalized anxiety disorder: Secondary | ICD-10-CM

## 2021-12-07 DIAGNOSIS — R030 Elevated blood-pressure reading, without diagnosis of hypertension: Secondary | ICD-10-CM | POA: Diagnosis not present

## 2021-12-07 NOTE — Assessment & Plan Note (Signed)
Agree that her elevated readings could be secondary to stress. Her work environment is not healthy.  Repeat BP today was much improved.  We agree that she should complete the rest of her time with her current employer working from home.   We also discussed to stop eating casseroles and fast food. We discussed to avoid frozen and canned foods which contain salt.   Continue to monitor. She will update if BP does not come down within a few weeks from working from home.

## 2021-12-07 NOTE — Assessment & Plan Note (Signed)
Exacerbated and seems secondary to her work place.   We agree to continue her current dose of sertraline at 125 mg daily.   We will have her start working from home effective December 10, 2021 through January 18, 2022. Letter provided.

## 2021-12-07 NOTE — Patient Instructions (Signed)
Your second blood pressure check today was normal.  Work on stress reduction.  Start working from home effective December 10, 2021.  Avoid salty foods such as canned, frozen, boxed, fast food.  Notify me if you continue to notice elevated BP readings despite reduced stress.  It was a pleasure to see you today!

## 2021-12-07 NOTE — Progress Notes (Signed)
Subjective:    Patient ID: Gloria Dyer, female    DOB: 04/12/1986, 36 y.o.   MRN: [id number]  HPI  Gloria Dyer is a very pleasant 36 y.o. female with a history of migraines, GERD, hyperlipidemia, GAD, obesity who presents today to discuss elevated blood pressure readings.  She presented to her GYN on 12/03/21 for an annual exam and her BP was noted to be elevated. Since then she's been checking her BP at home and is getting readings of [id number], [id number] systolic, [id number], 0000000.   She has been under a lot of stress at work, is not being treated well by her peers since returning to work on Monday this week after she had been out on a two week medical leave. Her peers are talking about her behind her back to other staff members. She's addressed this with her principal who hasn't done anything to stop this behavior.   She will be starting a new job on March 20th, is considering working from home through March 10th. She would like to start working from home starting December 10, 2021.   Her recent diet has consisted of eating casseroles, fast food. She will be starting on Wegovy today.   She denies increased headache intensity, dizziness, blurred vision. She highly suspects her elevated BP is secondary to stress from her occupation.   BP Readings from Last 3 Encounters:  12/07/21 126/78  12/03/21 (!) 143/82  11/26/21 136/84        Review of Systems  Constitutional:  Negative for fatigue.  Eyes:  Negative for visual disturbance.  Cardiovascular:  Negative for chest pain.  Neurological:  Negative for dizziness and headaches.        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   No children.   Works as a Pharmacist, hospital.   Currently in graduate school.   Social Determinants of Health   Financial Resource Strain: Not on file  Food Insecurity: Not on file  Transportation Needs: Not on file  Physical Activity: Not on file  Stress: Not on file  Social Connections: Not on file  Intimate Partner Violence: Not on file    Past Surgical History:  Procedure Laterality Date   ENDOMETRIAL BIOPSY     TONSILLECTOMY AND ADENOIDECTOMY      Family History  Problem Relation Age of Onset   Breast cancer Mother 8       stage 3C   Asthma Mother    Thyroid disease Mother    Berenice Primas' disease Mother    Hyperlipidemia Father    Stroke Father    Hypertension Father  Macular degeneration Father    Diabetes Paternal Grandmother    Hyperlipidemia Paternal Grandmother    Glaucoma Paternal Grandmother    Alzheimer's disease Paternal Grandmother    Diabetes Paternal Grandfather    Hyperlipidemia Paternal Grandfather    Heart disease Paternal Grandfather    Hypertension Paternal Grandfather    Kidney disease Paternal Grandfather    Macular degeneration Paternal Grandfather    Eating disorder Sister    Alzheimer's disease Maternal Grandfather    Breast cancer Maternal Aunt    Diabetes Paternal Aunt     No Known Allergies  Current Outpatient Medications on File Prior to Visit  Medication Sig Dispense Refill   Cholecalciferol (VITAMIN D3) 1.25 MG (50000 UT) CAPS Take 1 capsule by mouth once a week. For vitamin D. 12 capsule 0   cyanocobalamin 100 MCG tablet Take 100 mcg by mouth daily.      cyclobenzaprine (FLEXERIL) 5 MG tablet Take 1 tablet by mouth daily as needed at migraine onset. 30 tablet 0   fluticasone (FLONASE) 50 MCG/ACT nasal spray PLACE 1 SPRAY INTO BOTH NOSTRILS 2 (TWO) TIMES DAILY. 16 g 0   gabapentin (NEURONTIN) 300 MG capsule Take 3 capsules (900 mg total) by mouth at bedtime. For migraine prevention. 270 capsule 1   Melatonin 3 MG TABS Take 3 mg by mouth.     montelukast (SINGULAIR) 10 MG tablet TAKE 1 TABLET BY MOUTH EVERYDAY AT BEDTIME FOR ALLERGIES. 90 tablet 1   Norethindrone Acetate-Ethinyl Estradiol (JUNEL 1.5/30) 1.5-30 MG-MCG tablet Take 1 tablet by mouth daily. 84 tablet 3   pantoprazole (PROTONIX) 20 MG tablet Take 1 tablet (20 mg total) by mouth daily. For Reflux 90 tablet 3   Probiotic Product (ALIGN PO) Take 1 tablet by mouth.     sertraline (ZOLOFT) 100 MG tablet TAKE 1 TABLET (100 MG TOTAL) BY MOUTH DAILY. FOR ANXIETY. 90 tablet 2   sertraline (ZOLOFT) 25 MG tablet TAKE 1 TABLET (25 MG TOTAL) BY MOUTH DAILY. FOR ANXIETY. TAKE WITH 100 MG TABLET. 90 tablet 2   topiramate (TOPAMAX) 50 MG tablet TAKE 1 TABLET (50 MG TOTAL) BY MOUTH AT BEDTIME. FOR HEADACHE PREVENTION 90 tablet 3   Semaglutide-Weight Management (WEGOVY) 0.25 MG/0.5ML SOAJ Inject 0.25 mg into the skin once a week. (Patient not taking: Reported on 11/26/2021) 2 mL 0   No current facility-administered medications on file prior to visit.    BP 126/78    Pulse 97    Temp 97.6 F (36.4 C) (Temporal)    Ht 5\' 7"  (1.702 m)    Wt (!) 321 lb (145.6 kg)    SpO2 97%    BMI 50.28 kg/m  Objective:   Physical Exam Cardiovascular:     Rate and Rhythm: Normal rate and regular rhythm.  Pulmonary:     Effort: Pulmonary effort is normal.     Breath sounds: Normal breath sounds.  Musculoskeletal:     Cervical back: Neck supple.  Skin:    General: Skin is warm and dry.  Psychiatric:        Mood and Affect: Mood normal.          Assessment & Plan:      This visit occurred during the  SARS-CoV-2 public health emergency.  Safety protocols were in place, including screening questions prior to the visit, additional usage of staff PPE, and extensive cleaning of exam room while observing appropriate contact time as indicated for disinfecting solutions.

## 2021-12-10 ENCOUNTER — Encounter: Payer: Self-pay | Admitting: Family Medicine

## 2021-12-18 NOTE — Telephone Encounter (Signed)
Completed and placed on Joellen's desk 

## 2021-12-18 NOTE — Telephone Encounter (Signed)
I have not seen FMLA paperwork, have you seen it?

## 2021-12-31 MED ORDER — WEGOVY 0.5 MG/0.5ML ~~LOC~~ SOAJ: 0.5000 mg | SUBCUTANEOUS | 0 refills | Status: DC

## 2021-12-31 NOTE — Telephone Encounter (Signed)
I do not see recent one in chart do you?

## 2022-01-01 ENCOUNTER — Other Ambulatory Visit: Payer: Self-pay | Admitting: Primary Care

## 2022-01-01 DIAGNOSIS — K219 Gastro-esophageal reflux disease without esophagitis: Secondary | ICD-10-CM

## 2022-01-22 ENCOUNTER — Other Ambulatory Visit: Payer: Self-pay | Admitting: Primary Care

## 2022-01-22 DIAGNOSIS — E559 Vitamin D deficiency, unspecified: Secondary | ICD-10-CM

## 2022-01-24 MED ORDER — WEGOVY 0.5 MG/0.5ML ~~LOC~~ SOAJ: 0.5000 mg | SUBCUTANEOUS | 0 refills | Status: DC

## 2022-02-19 ENCOUNTER — Ambulatory Visit: Payer: BC Managed Care – PPO | Admitting: Primary Care

## 2022-02-19 ENCOUNTER — Other Ambulatory Visit: Payer: Self-pay | Admitting: Primary Care

## 2022-02-19 ENCOUNTER — Encounter: Payer: Self-pay | Admitting: Primary Care

## 2022-02-19 VITALS — BP 118/86 | HR 74 | Ht 68.0 in | Wt 307.0 lb

## 2022-02-19 DIAGNOSIS — E538 Deficiency of other specified B group vitamins: Secondary | ICD-10-CM | POA: Diagnosis not present

## 2022-02-19 DIAGNOSIS — F411 Generalized anxiety disorder: Secondary | ICD-10-CM | POA: Diagnosis not present

## 2022-02-19 DIAGNOSIS — G43709 Chronic migraine without aura, not intractable, without status migrainosus: Secondary | ICD-10-CM

## 2022-02-19 DIAGNOSIS — E559 Vitamin D deficiency, unspecified: Secondary | ICD-10-CM

## 2022-02-19 MED ORDER — SEMAGLUTIDE (1 MG/DOSE) 4 MG/3ML ~~LOC~~ SOPN: 1.0000 mg | PEN_INJECTOR | SUBCUTANEOUS | 2 refills | Status: DC

## 2022-02-19 NOTE — Patient Instructions (Addendum)
Stop by the lab prior to leaving today. I will notify you of your results once received.  ? ?We increased your dose of Wegovy to 1 mg weekly.  ? ?Please schedule a follow up visit for 3 months. ? ?It was a pleasure to see you today! ? ?

## 2022-02-19 NOTE — Progress Notes (Signed)
? ?Subjective:  ? ? Patient ID: Gloria Dyer, female    DOB: [birth year], 36 y.o.   MRN: [id number] ? ?HPI ? ?Gloria Dyer is a very pleasant 36 y.o. female with a history of obesity, elevated blood pressure reading, hyperlipidemia, GERD who presents today for follow-up of obesity and anxiety. She is also needing repeat labs for vitamin D and B12.  ? ?1) Obesity: Currently managed on Wegovy 0.5 mg weekly for which she began in January 2023. She is here for follow up today. ? ?Since her last visit she has lost 14 pounds. She has found that [facility] has helped a lot with cravings and preventing her from overeating.  ? ?She's drinking mostly water. She endorses an improved diet which is home cooked meals, more vegetables. She has reduced fast food, is making better choices if she has to stop for fast food.  ? ?She is compliant to vitamin B12 1000 mcg daily and vitamin D3 50,000 IUs weekly.  She is also taking vitamin D3 1000 IUs daily. ? ?Wt Readings from Last 3 Encounters:  ?02/19/22 (!) 307 lb (139.3 kg)  ?12/07/21 (!) 321 lb (145.6 kg)  ?12/03/21 (!) 323 lb (146.5 kg)  ? ?2) Anxiety and Depression: Chronic for years, worse over the last year.  Her place of employment was the source of her anxiety and stress.  Since her last visit she has started a new job as of 6 weeks ago, is feeling much better overall and lives her new role. ? ?She is sleeping well, feeling less anxious, no migraines in 6 weeks. She would like to wean down on sertraline.  She is currently taking 125 mg daily. ? ? ? ? ? ?Review of Systems  ?Respiratory:  Negative for shortness of breath.   ?Cardiovascular:  Negative for chest pain.  ?Gastrointestinal:  Negative for abdominal pain and constipation.  ?Neurological:  Positive for headaches.  ?     No migraine in 6 weeks, headaches overall have improved  ?Psychiatric/Behavioral:  The patient is not nervous/anxious.   ? ?   ? ? ?Past Medical History:  ?Diagnosis Date  ? Endometriosis   ? GERD  (gastroesophageal reflux disease)   ? Hyperlipidemia   ? IBS (irritable bowel syndrome)   ? Migraines   ? Positive TB test   ? skin test  ? ? ?Social History  ? ?Socioeconomic History  ? Marital status: Married  ?  Spouse name: Not on file  ? Number of children: Not on file  ? Years of education: Not on file  ? Highest education level: Not on file  ?Occupational History  ? Not on file  ?Tobacco Use  ? Smoking status: Never  ? Smokeless tobacco: Never  ?Substance and Sexual Activity  ? Alcohol use: No  ? Drug use: No  ? Sexual activity: Not Currently  ?  Partners: Male  ?  Birth control/protection: OCP  ?Other Topics Concern  ? Not on file  ?Social History Narrative  ? Single.  ? No children.  ? 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  ? ?Social Determinants of Health  ? ?Financial Resource Strain: Not on file  ?Food Insecurity: Not on file  ?Transportation Needs: Not on file  ?Physical Activity: Not on file  ?Stress: Not on file  ?Social Connections: Not on file  ?Intimate Partner Violence: Not on file  ? ? ?Past Surgical History:  ?Procedure Laterality Date  ? ENDOMETRIAL BIOPSY    ?  TONSILLECTOMY AND ADENOIDECTOMY    ? ? ?Family History  ?Problem Relation Age of Onset  ? Breast cancer Mother 46  ?     stage 3C  ? Asthma Mother   ? Thyroid disease Mother   ? Graves' disease Mother   ? Hyperlipidemia Father   ? Stroke Father   ? Hypertension Father   ? Macular degeneration Father   ? Diabetes Paternal Grandmother   ? Hyperlipidemia Paternal Grandmother   ? Glaucoma Paternal Grandmother   ? Alzheimer's disease Paternal Grandmother   ? Diabetes Paternal Grandfather   ? Hyperlipidemia Paternal Grandfather   ? Heart disease Paternal Grandfather   ? Hypertension Paternal Grandfather   ? Kidney disease Paternal Grandfather   ? Macular degeneration Paternal Grandfather   ? Eating disorder Sister   ? Alzheimer's disease Maternal Grandfather   ? Breast cancer Maternal Aunt   ? Diabetes Paternal Aunt   ? ? ?No  Known Allergies ? ?Current Outpatient Medications on File Prior to Visit  ?Medication Sig Dispense Refill  ? Cholecalciferol (D3-50) 1.25 MG (50000 UT) capsule TAKE 1 CAPSULE BY MOUTH ONCE A WEEK. FOR VITAMIN D. 4 capsule 0  ? cyanocobalamin 100 MCG tablet Take 100 mcg by mouth daily.    ? cyclobenzaprine (FLEXERIL) 5 MG tablet Take 1 tablet by mouth daily as needed at migraine onset. 30 tablet 0  ? fluticasone (FLONASE) 50 MCG/ACT nasal spray PLACE 1 SPRAY INTO BOTH NOSTRILS 2 (TWO) TIMES DAILY. 16 g 0  ? gabapentin (NEURONTIN) 300 MG capsule Take 3 capsules (900 mg total) by mouth at bedtime. For migraine prevention. 270 capsule 1  ? Melatonin 3 MG TABS Take 3 mg by mouth.    ? montelukast (SINGULAIR) 10 MG tablet TAKE 1 TABLET BY MOUTH EVERYDAY AT BEDTIME FOR ALLERGIES. 90 tablet 1  ? Norethindrone Acetate-Ethinyl Estradiol (JUNEL 1.5/30) 1.5-30 MG-MCG tablet Take 1 tablet by mouth daily. 84 tablet 3  ? pantoprazole (PROTONIX) 20 MG tablet Take 1 tablet (20 mg total) by mouth daily. For Reflux 90 tablet 3  ? Probiotic Product (ALIGN PO) Take 1 tablet by mouth.    ? sertraline (ZOLOFT) 100 MG tablet TAKE 1 TABLET (100 MG TOTAL) BY MOUTH DAILY. FOR ANXIETY. 90 tablet 2  ? sertraline (ZOLOFT) 25 MG tablet TAKE 1 TABLET (25 MG TOTAL) BY MOUTH DAILY. FOR ANXIETY. TAKE WITH 100 MG TABLET. 90 tablet 2  ? topiramate (TOPAMAX) 50 MG tablet TAKE 1 TABLET (50 MG TOTAL) BY MOUTH AT BEDTIME. FOR HEADACHE PREVENTION 90 tablet 3  ? ?No current facility-administered medications on file prior to visit.  ? ? ?BP 118/86   Pulse 74   Ht 5\' 8"  (1.727 m)   Wt (!) 307 lb (139.3 kg)   SpO2 96%   BMI 46.68 kg/m?  ?Objective:  ? Physical Exam ?Cardiovascular:  ?   Rate and Rhythm: Normal rate and regular rhythm.  ?Pulmonary:  ?   Effort: Pulmonary effort is normal.  ?   Breath sounds: Normal breath sounds.  ?Musculoskeletal:  ?   Cervical back: Neck supple.  ?Skin: ?   General: Skin is warm and dry.  ?Psychiatric:     ?   Mood and  Affect: Mood normal.  ?   Comments: Appears much better overall today, smiling.  ? ? ? ? ? ?   ?Assessment & Plan:  ? ? ? ? ?This visit occurred during the SARS-CoV-2 public health emergency.  Safety protocols were in place, including  screening questions prior to the visit, additional usage of staff PPE, and extensive cleaning of exam room while observing appropriate contact time as indicated for disinfecting solutions.  ?

## 2022-02-19 NOTE — Assessment & Plan Note (Signed)
Improved since she has switched jobs.! ? ?Stop sertraline 25 mg daily. ?Continue on 100 mg daily. ? ?She will update in a few weeks, if doing well then will reduce to 50 mg daily. ?

## 2022-02-19 NOTE — Assessment & Plan Note (Signed)
Significant improvement since she has changed jobs! ? ?Continue gabapentin 900 mg at bedtime for now, consider reducing to 600 mg at bedtime. ?

## 2022-02-19 NOTE — Assessment & Plan Note (Signed)
Continue weekly vitamin D 50,000 IUs, 1000 IUs daily. ? ?Repeat vitamin D level pending. ?

## 2022-02-19 NOTE — Assessment & Plan Note (Addendum)
Continue vitamin B12 1000 mcg daily. Repeat vitamin B12 level pending. 

## 2022-02-19 NOTE — Assessment & Plan Note (Signed)
Weight loss of 14 pounds since last visit, commended her on this! ? ?Strongly advised she continue to work on her diet and implement some exercise ? ?Increase Wegovy to 1 mg weekly. New Rx sent to pharmacy.  ? ?Follow up in 3 months. ?

## 2022-02-20 LAB — VITAMIN B12: Vitamin B-12: 369 pg/mL (ref 211–911)

## 2022-02-20 LAB — VITAMIN D 25 HYDROXY (VIT D DEFICIENCY, FRACTURES): VITD: 31.68 ng/mL (ref 30.00–100.00)

## 2022-02-20 LAB — HEMOGLOBIN A1C: Hgb A1c MFr Bld: 5.6 % (ref 4.6–6.5)

## 2022-03-15 ENCOUNTER — Other Ambulatory Visit: Payer: Self-pay | Admitting: Primary Care

## 2022-03-15 MED ORDER — WEGOVY 0.5 MG/0.5ML ~~LOC~~ SOAJ: 0.5000 mg | SUBCUTANEOUS | 1 refills | Status: DC

## 2022-03-19 ENCOUNTER — Other Ambulatory Visit: Payer: Self-pay | Admitting: Primary Care

## 2022-03-20 ENCOUNTER — Other Ambulatory Visit: Payer: Self-pay | Admitting: Primary Care

## 2022-03-20 MED ORDER — WEGOVY 0.5 MG/0.5ML ~~LOC~~ SOAJ: 0.5000 mg | SUBCUTANEOUS | 1 refills | Status: AC

## 2022-03-29 IMAGING — CT CT HEAD W/O CM
4 series · 17 of 47 positions shown, 19 images · non-contrast
Comparison: 08/20/2020

CLINICAL DATA: Headaches, dizziness, concussion concerns

EXAM:
CT HEAD WITHOUT CONTRAST
TECHNIQUE: Contiguous axial images were obtained from the base of the skull
through the vertex without intravenous contrast.

[Series 2: head wo · axial · 0.46mm/px · z∈[+293,+413]mm · 7 of 33 slices shown, 9 images]
[im 5/33  brain]
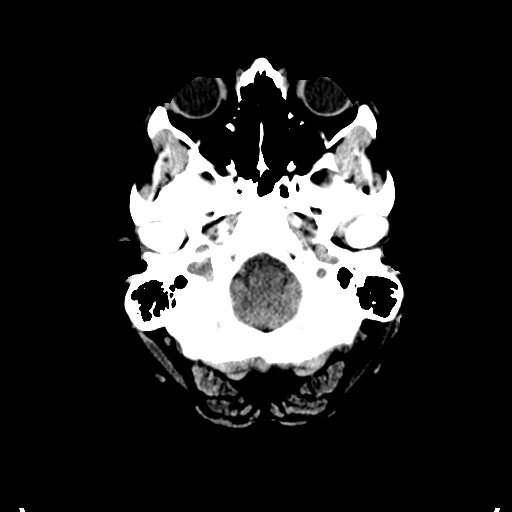
[im 5/33  bone]
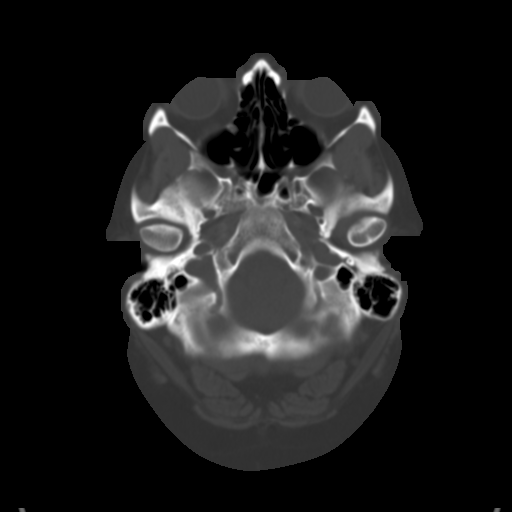
[im 9/33  brain]
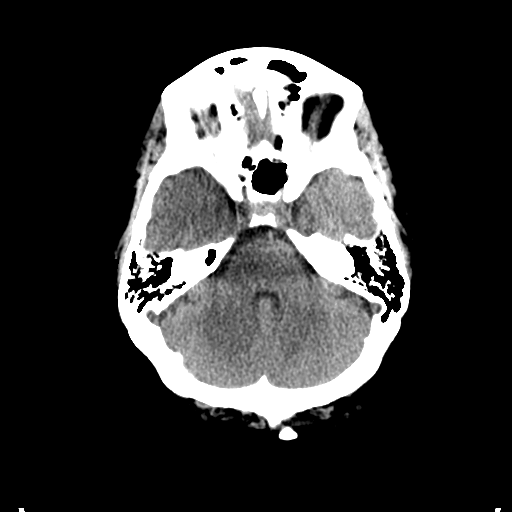
[im 13/33  brain]
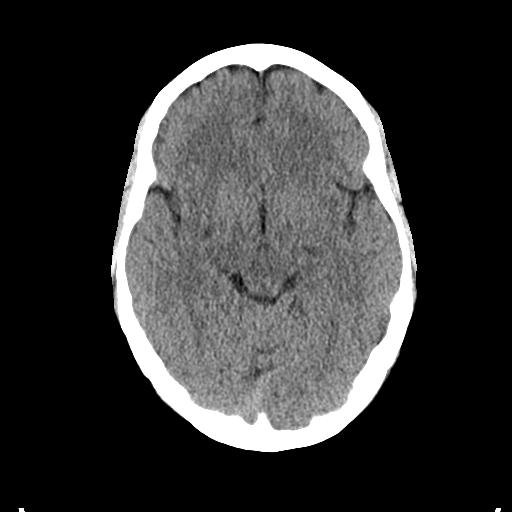
[im 17/33  brain]
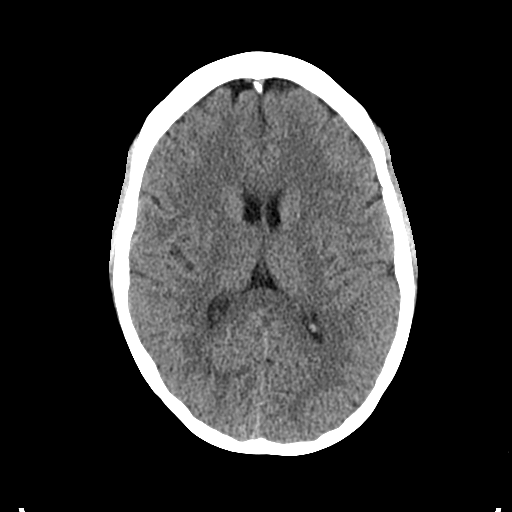
[im 21/33  brain]
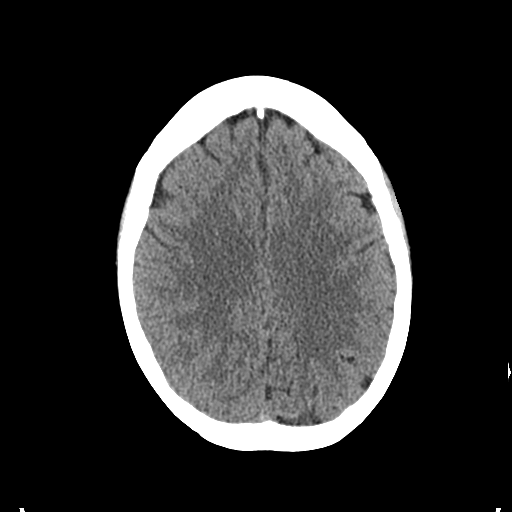
[im 21/33  bone]
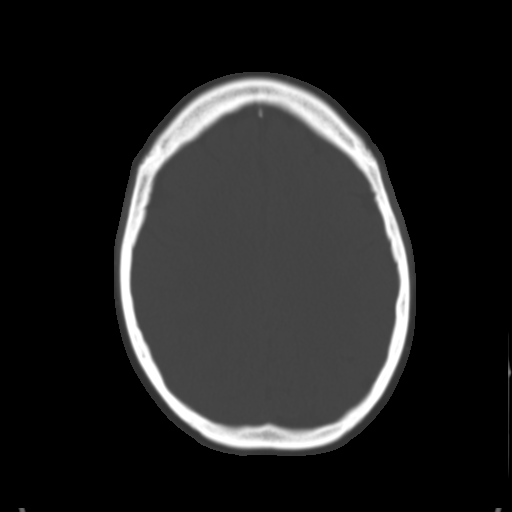
[im 25/33  brain]
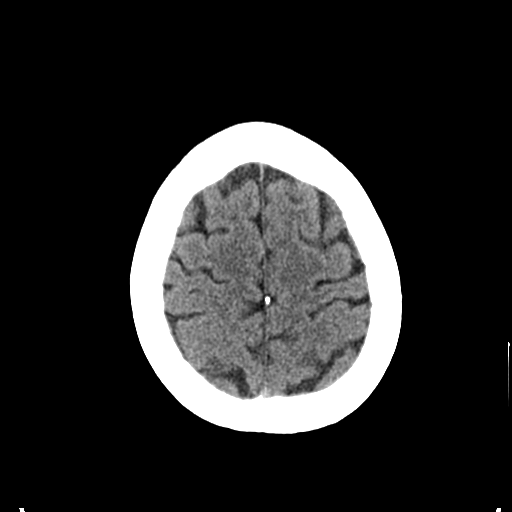
[im 29/33  brain]
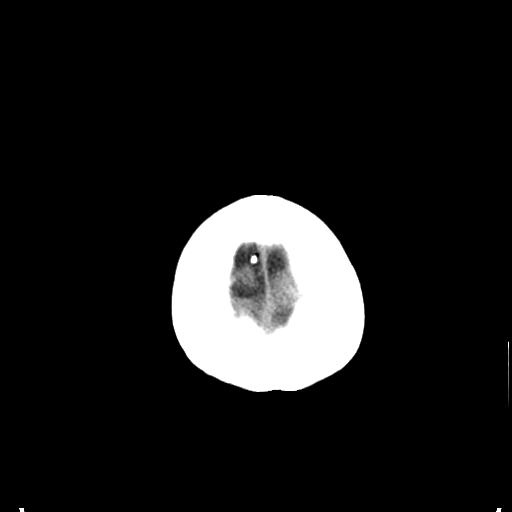

[Series 3: head bone · axial · 0.46mm/px · z∈[+289,+345]mm · 4 of 83 slices shown]
[im 9/83  bone]
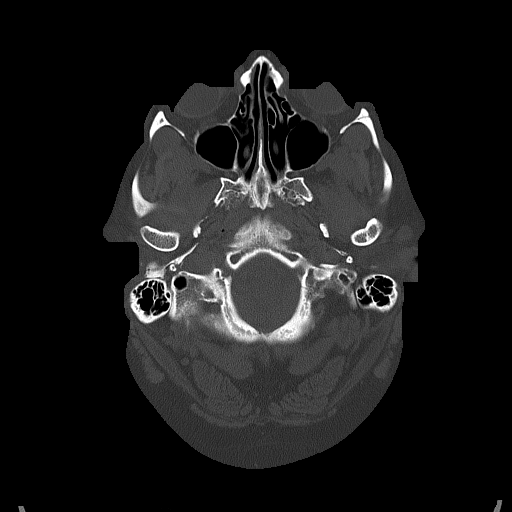
[im 17/83  bone]
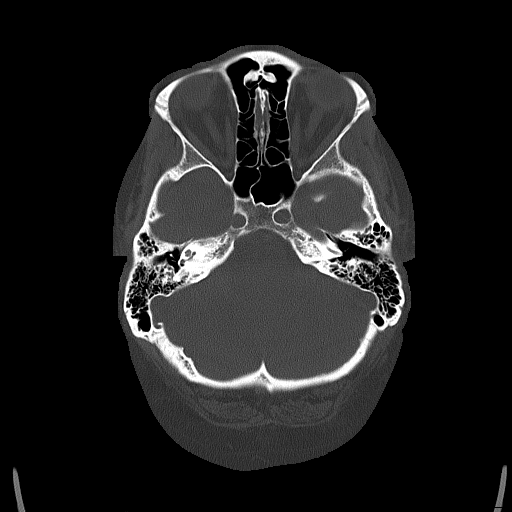
[im 25/83  bone]
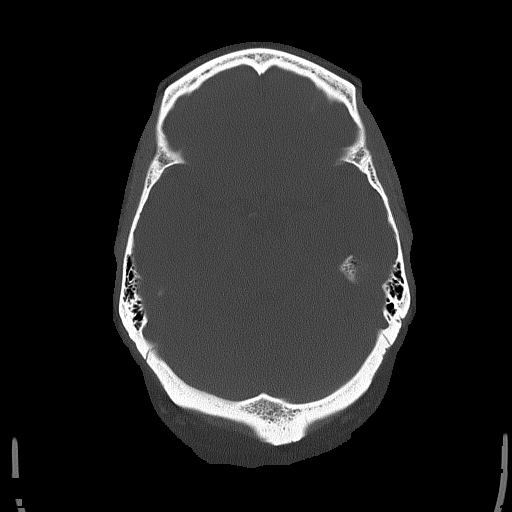
[im 37/83  bone]
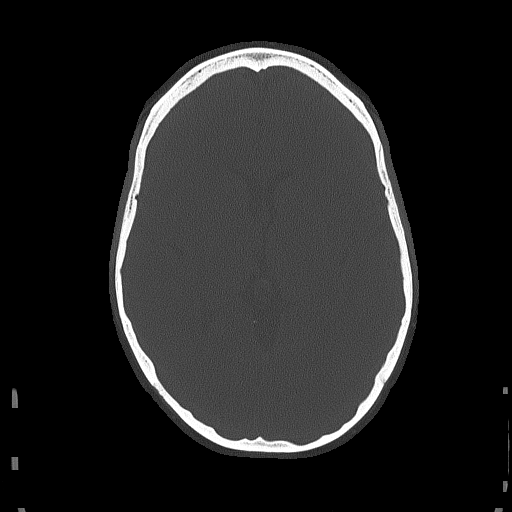

[Series 4: coronal soft tissue · coronal · 0.36mm/px · 3 of 76 slices shown]
[im 26/76  brain]
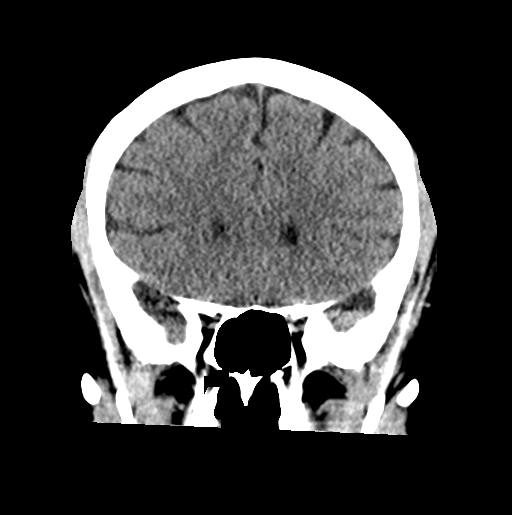
[im 34/76  brain]
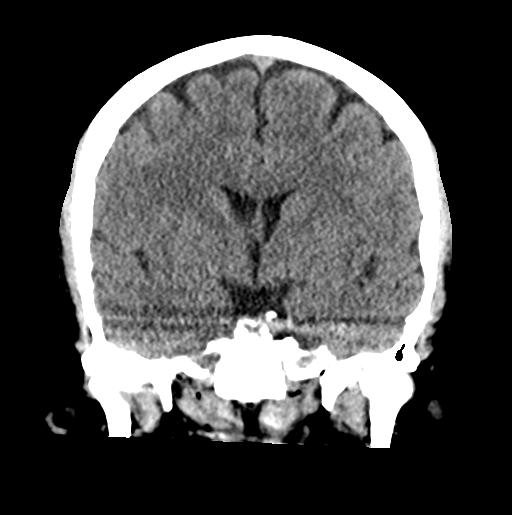
[im 42/76  brain]
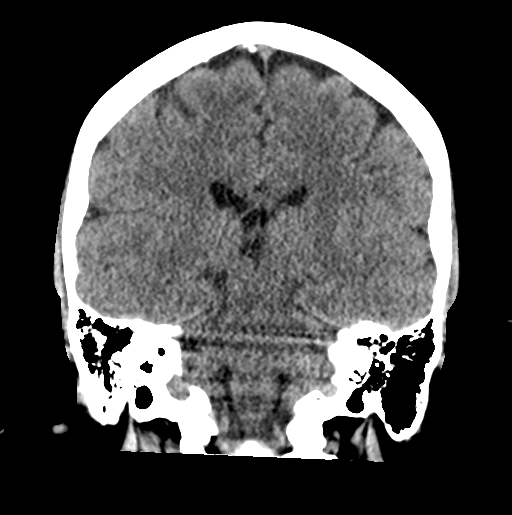

[Series 5: sagittal soft tissue · sagittal · 0.38mm/px · 3 of 61 slices shown]
[im 21/61  brain]
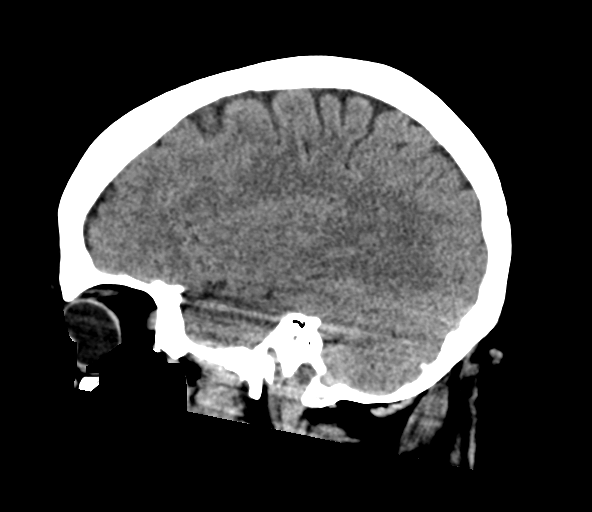
[im 31/61  brain]
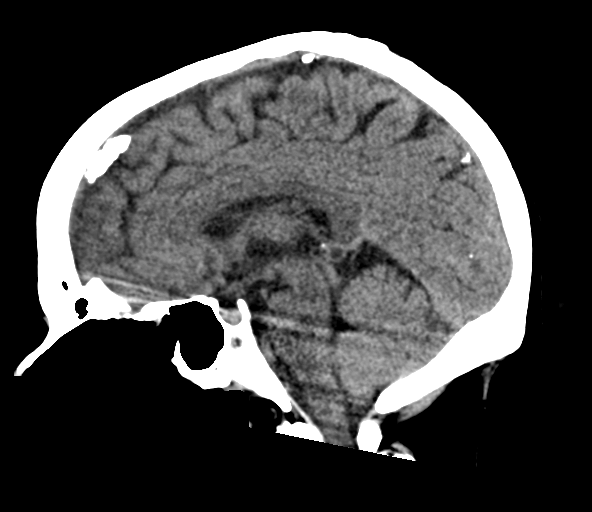
[im 41/61  brain]
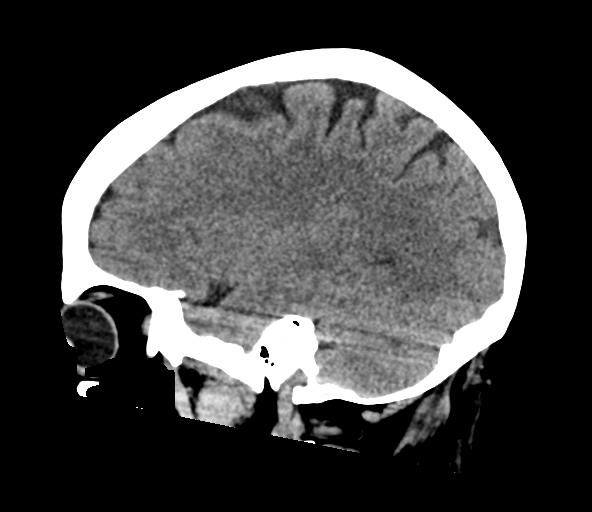

[17 of 47 positions shown; findings below may reference images not displayed]

FINDINGS: Brain: No evidence of acute infarction, hemorrhage, hydrocephalus,
extra-axial collection or mass lesion/mass effect.

Vascular: No hyperdense vessel or unexpected calcification.

Skull: Normal. Negative for fracture or focal lesion.

Sinuses/Orbits: No acute finding.

Other: None.
IMPRESSION: Normal head CT without contrast for age

## 2022-04-05 ENCOUNTER — Other Ambulatory Visit: Payer: Self-pay | Admitting: Primary Care

## 2022-04-05 DIAGNOSIS — J329 Chronic sinusitis, unspecified: Secondary | ICD-10-CM

## 2022-04-23 IMAGING — DX DG CHEST 2V
2 series · 2 of 2 positions shown · non-contrast
Comparison: Chest x-ray dated March 22, 2009.

CLINICAL DATA: TB screening.  Positive TB skin test.

EXAM:
CHEST - 2 VIEW

[chest pa]
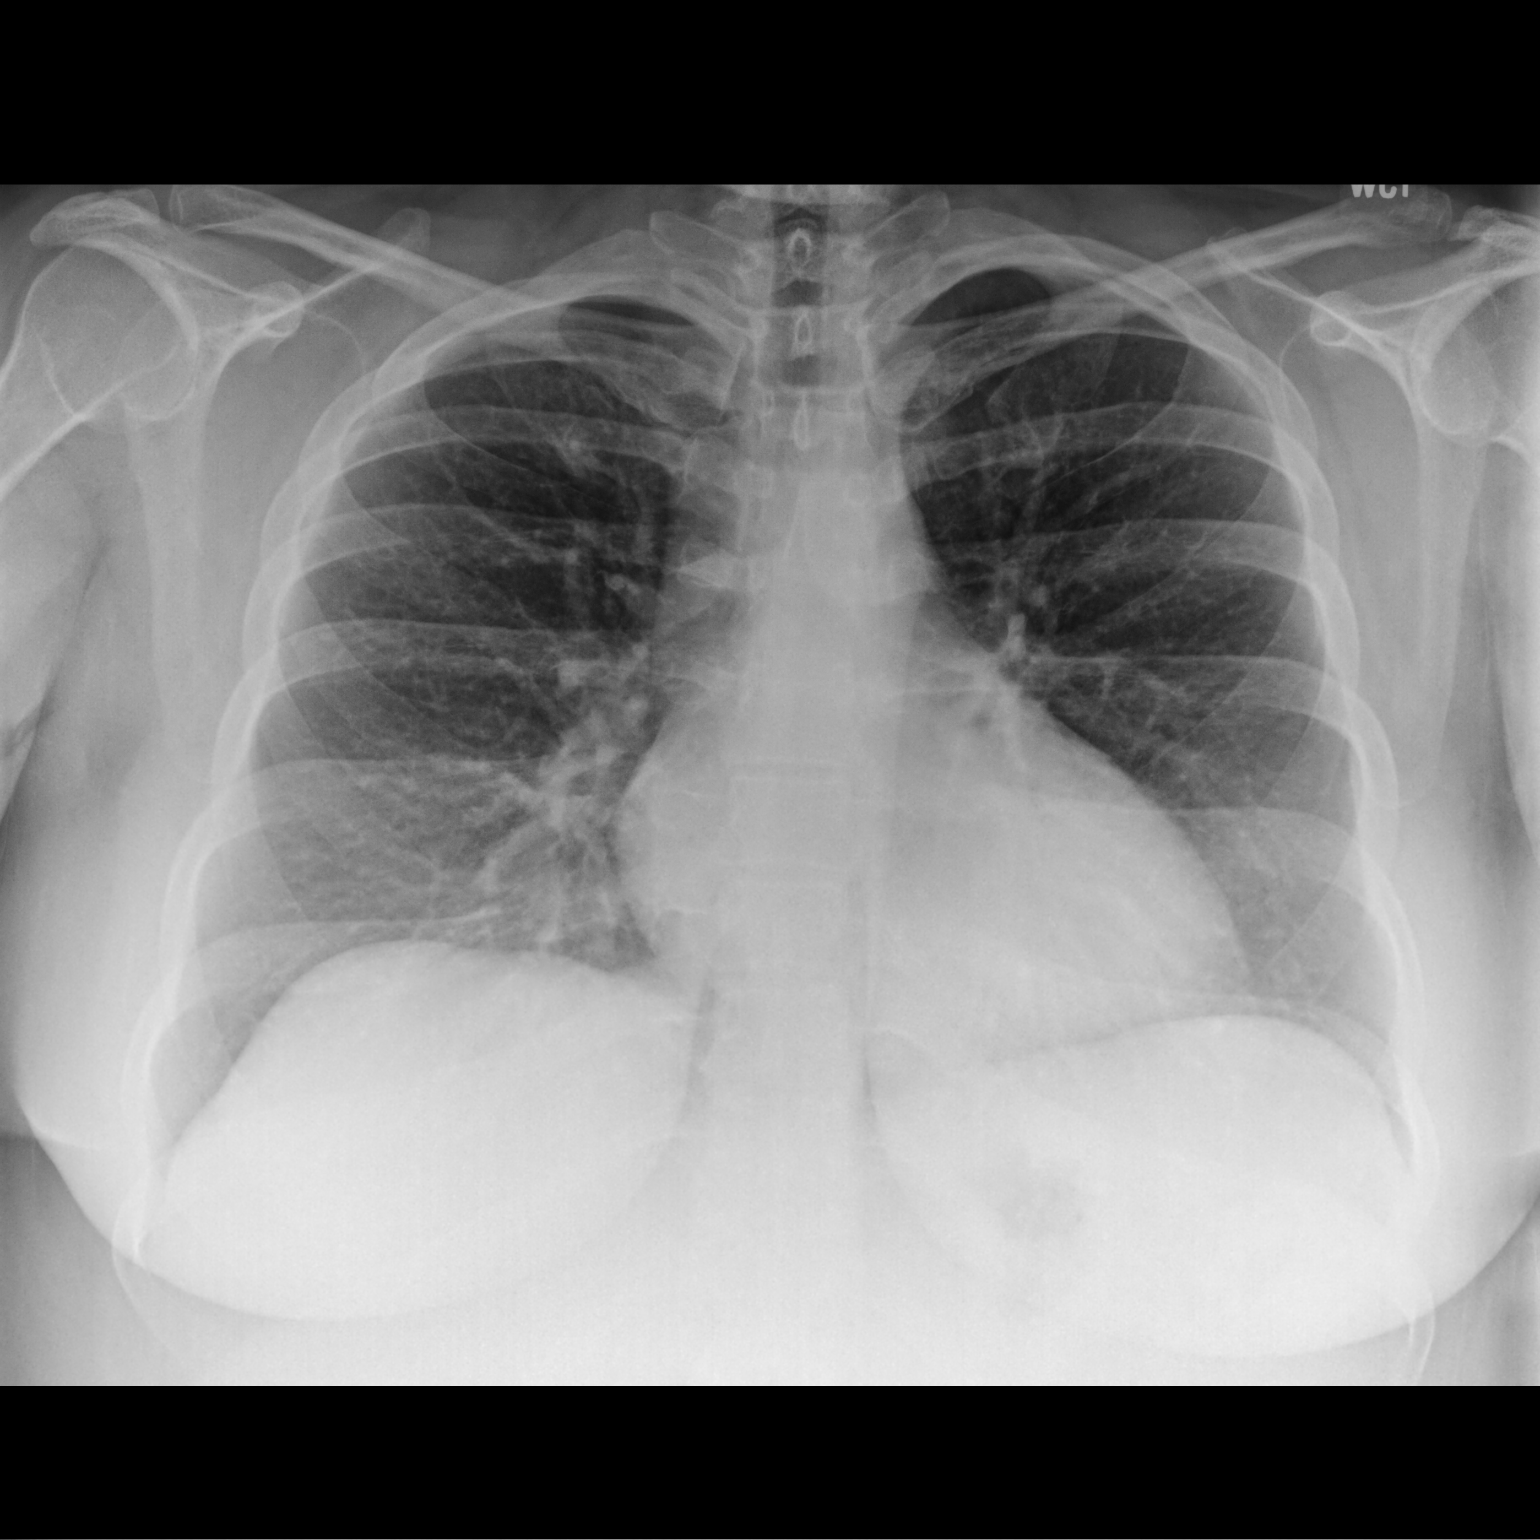

[chest lat]
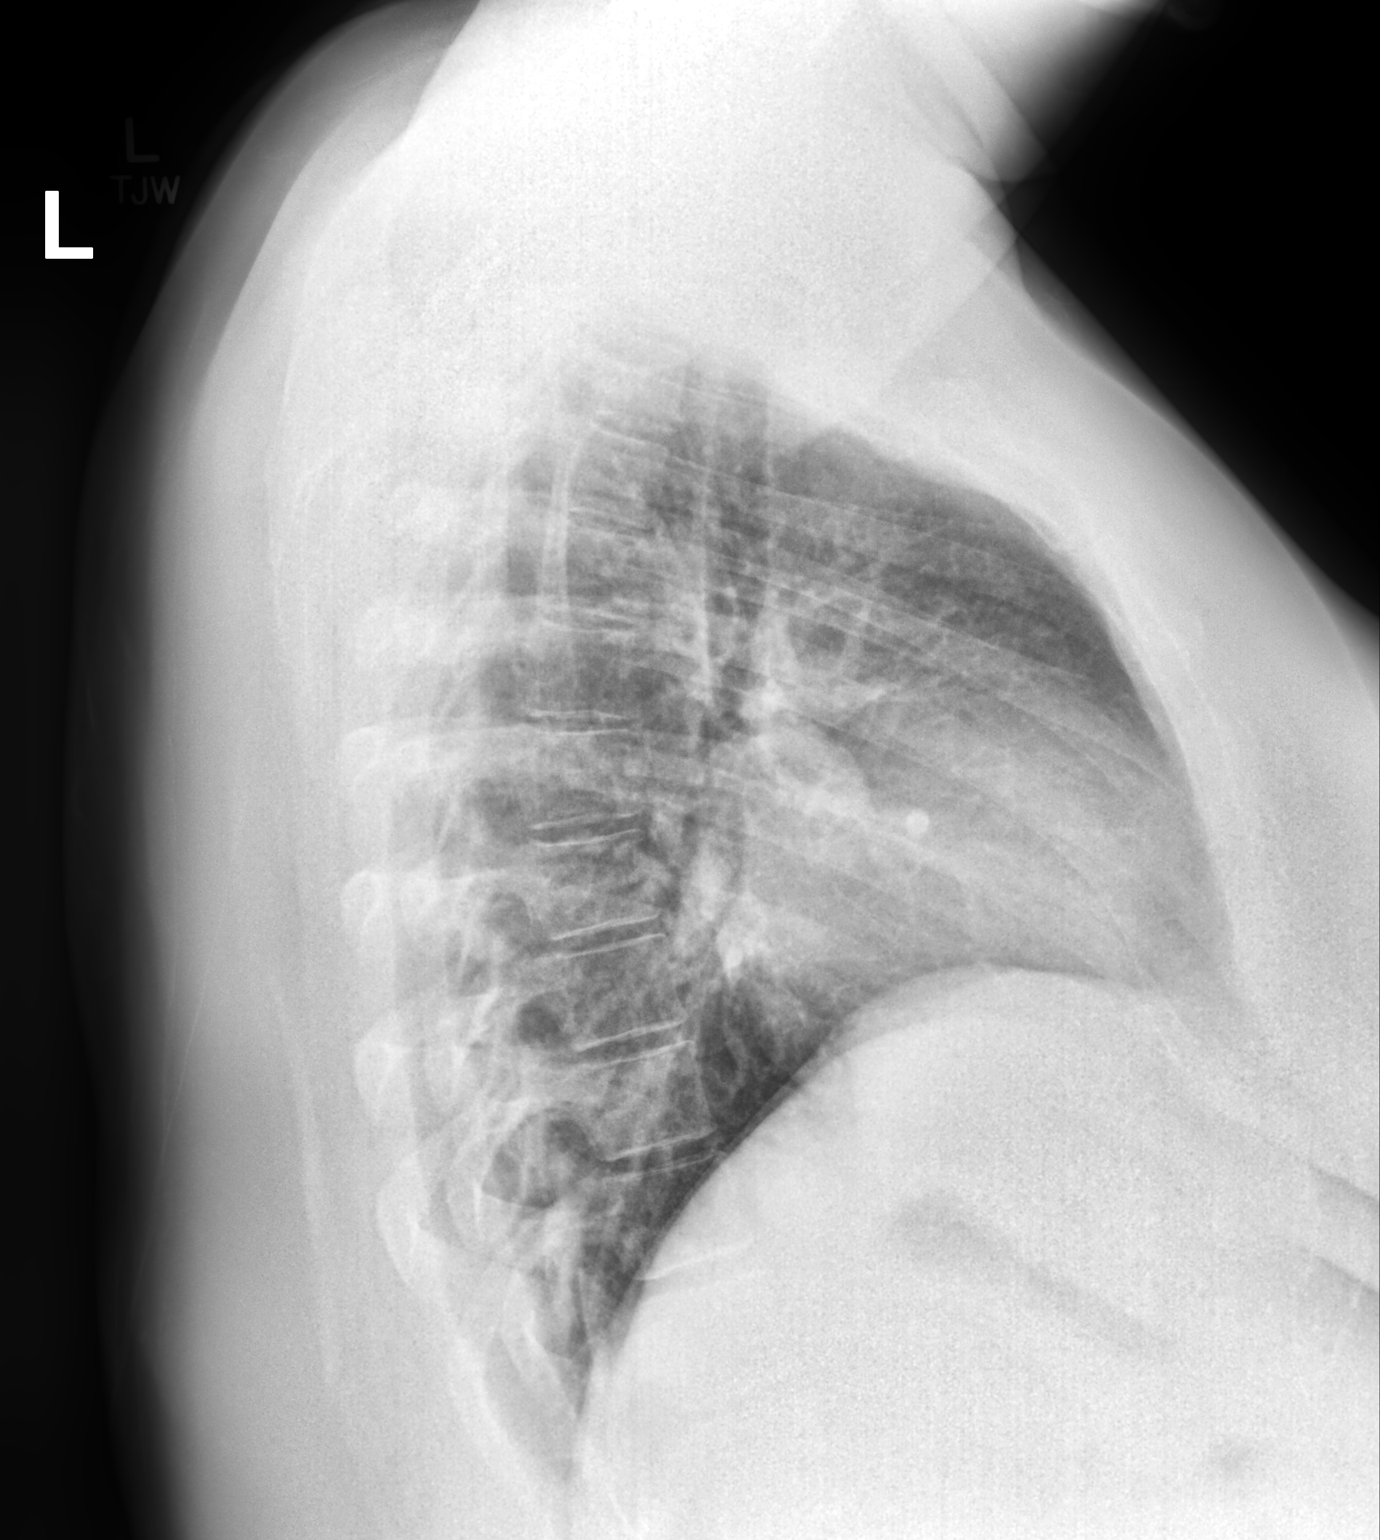

[2 of 2 positions shown; findings below may reference images not displayed]

FINDINGS: The heart size and mediastinal contours are within normal limits.
Both lungs are clear. The visualized skeletal structures are
unremarkable.
IMPRESSION: No active cardiopulmonary disease.

## 2022-05-19 ENCOUNTER — Other Ambulatory Visit: Payer: Self-pay | Admitting: Primary Care

## 2022-05-19 DIAGNOSIS — E559 Vitamin D deficiency, unspecified: Secondary | ICD-10-CM

## 2022-07-06 ENCOUNTER — Other Ambulatory Visit: Payer: Self-pay | Admitting: Primary Care

## 2022-07-06 DIAGNOSIS — J329 Chronic sinusitis, unspecified: Secondary | ICD-10-CM

## 2022-08-05 ENCOUNTER — Ambulatory Visit: Payer: BC Managed Care – PPO

## 2022-08-13 ENCOUNTER — Other Ambulatory Visit: Payer: Self-pay | Admitting: Primary Care

## 2022-08-13 DIAGNOSIS — E559 Vitamin D deficiency, unspecified: Secondary | ICD-10-CM

## 2022-08-19 ENCOUNTER — Other Ambulatory Visit: Payer: Self-pay | Admitting: Primary Care

## 2022-08-19 DIAGNOSIS — F411 Generalized anxiety disorder: Secondary | ICD-10-CM

## 2022-08-23 ENCOUNTER — Telehealth: Payer: Self-pay

## 2022-08-23 NOTE — Telephone Encounter (Signed)
Left message to call office back inquiring about getting a flu shot.   

## 2022-10-05 ENCOUNTER — Other Ambulatory Visit: Payer: Self-pay | Admitting: Primary Care

## 2022-10-05 DIAGNOSIS — J329 Chronic sinusitis, unspecified: Secondary | ICD-10-CM
